# Patient Record
Sex: Female | Born: 1970 | Race: White | Hispanic: No | Marital: Married | Smoking: Current every day smoker
Health system: Southern US, Community
[De-identification: ages and names within clinical notes are randomized; demographics above are authoritative.]

## PROBLEM LIST (undated history)

## (undated) DIAGNOSIS — F419 Anxiety disorder, unspecified: Secondary | ICD-10-CM

## (undated) DIAGNOSIS — E559 Vitamin D deficiency, unspecified: Secondary | ICD-10-CM

## (undated) DIAGNOSIS — R7303 Prediabetes: Secondary | ICD-10-CM

## (undated) DIAGNOSIS — O99345 Other mental disorders complicating the puerperium: Secondary | ICD-10-CM

## (undated) DIAGNOSIS — M1990 Unspecified osteoarthritis, unspecified site: Secondary | ICD-10-CM

## (undated) DIAGNOSIS — F53 Postpartum depression: Secondary | ICD-10-CM

## (undated) DIAGNOSIS — G629 Polyneuropathy, unspecified: Secondary | ICD-10-CM

## (undated) DIAGNOSIS — O119 Pre-existing hypertension with pre-eclampsia, unspecified trimester: Secondary | ICD-10-CM

## (undated) DIAGNOSIS — I10 Essential (primary) hypertension: Secondary | ICD-10-CM

## (undated) DIAGNOSIS — K469 Unspecified abdominal hernia without obstruction or gangrene: Secondary | ICD-10-CM

## (undated) HISTORY — PX: HERNIA REPAIR: SHX51

## (undated) HISTORY — DX: Vitamin D deficiency, unspecified: E55.9

## (undated) HISTORY — PX: NERVE SURGERY: SHX1016

## (undated) HISTORY — PX: OTHER SURGICAL HISTORY: SHX169

## (undated) HISTORY — PX: ELBOW SURGERY: SHX618

## (undated) HISTORY — DX: Prediabetes: R73.03

---

## 1997-08-08 ENCOUNTER — Inpatient Hospital Stay (HOSPITAL_COMMUNITY): Admission: AD | Admit: 1997-08-08 | Discharge: 1997-08-09 | Payer: Self-pay | Admitting: *Deleted

## 1998-05-07 ENCOUNTER — Encounter: Payer: Self-pay | Admitting: Family Medicine

## 1998-05-07 ENCOUNTER — Ambulatory Visit (HOSPITAL_COMMUNITY): Admission: RE | Admit: 1998-05-07 | Discharge: 1998-05-07 | Payer: Self-pay | Admitting: Family Medicine

## 1998-09-29 ENCOUNTER — Other Ambulatory Visit: Admission: RE | Admit: 1998-09-29 | Discharge: 1998-09-29 | Payer: Self-pay | Admitting: *Deleted

## 2000-10-24 ENCOUNTER — Other Ambulatory Visit: Admission: RE | Admit: 2000-10-24 | Discharge: 2000-10-24 | Payer: Self-pay | Admitting: *Deleted

## 2001-12-24 ENCOUNTER — Other Ambulatory Visit: Admission: RE | Admit: 2001-12-24 | Discharge: 2001-12-24 | Payer: Self-pay | Admitting: Obstetrics and Gynecology

## 2002-05-05 ENCOUNTER — Emergency Department (HOSPITAL_COMMUNITY): Admission: EM | Admit: 2002-05-05 | Discharge: 2002-05-06 | Payer: Self-pay | Admitting: Emergency Medicine

## 2002-05-06 ENCOUNTER — Encounter: Payer: Self-pay | Admitting: Emergency Medicine

## 2003-01-02 ENCOUNTER — Other Ambulatory Visit: Admission: RE | Admit: 2003-01-02 | Discharge: 2003-01-02 | Payer: Self-pay | Admitting: Obstetrics and Gynecology

## 2005-03-07 ENCOUNTER — Emergency Department (HOSPITAL_COMMUNITY): Admission: EM | Admit: 2005-03-07 | Discharge: 2005-03-07 | Payer: Self-pay | Admitting: Emergency Medicine

## 2005-09-11 ENCOUNTER — Ambulatory Visit (HOSPITAL_COMMUNITY): Admission: RE | Admit: 2005-09-11 | Discharge: 2005-09-11 | Payer: Self-pay | Admitting: Obstetrics and Gynecology

## 2005-12-28 ENCOUNTER — Encounter: Admission: RE | Admit: 2005-12-28 | Discharge: 2005-12-28 | Payer: Self-pay | Admitting: Obstetrics and Gynecology

## 2006-02-09 ENCOUNTER — Inpatient Hospital Stay (HOSPITAL_COMMUNITY): Admission: AD | Admit: 2006-02-09 | Discharge: 2006-02-10 | Payer: Self-pay | Admitting: Obstetrics and Gynecology

## 2006-02-14 ENCOUNTER — Encounter: Admission: RE | Admit: 2006-02-14 | Discharge: 2006-03-16 | Payer: Self-pay | Admitting: Obstetrics and Gynecology

## 2006-03-17 ENCOUNTER — Encounter: Admission: RE | Admit: 2006-03-17 | Discharge: 2006-04-15 | Payer: Self-pay | Admitting: Obstetrics and Gynecology

## 2006-04-16 ENCOUNTER — Encounter: Admission: RE | Admit: 2006-04-16 | Discharge: 2006-05-16 | Payer: Self-pay | Admitting: Obstetrics and Gynecology

## 2006-05-17 ENCOUNTER — Encounter: Admission: RE | Admit: 2006-05-17 | Discharge: 2006-06-16 | Payer: Self-pay | Admitting: Obstetrics and Gynecology

## 2006-06-17 ENCOUNTER — Encounter: Admission: RE | Admit: 2006-06-17 | Discharge: 2006-07-15 | Payer: Self-pay | Admitting: Obstetrics and Gynecology

## 2006-07-16 ENCOUNTER — Encounter: Admission: RE | Admit: 2006-07-16 | Discharge: 2006-08-15 | Payer: Self-pay | Admitting: Obstetrics and Gynecology

## 2006-08-16 ENCOUNTER — Encounter: Admission: RE | Admit: 2006-08-16 | Discharge: 2006-09-14 | Payer: Self-pay | Admitting: Obstetrics and Gynecology

## 2006-09-15 ENCOUNTER — Encounter: Admission: RE | Admit: 2006-09-15 | Discharge: 2006-10-15 | Payer: Self-pay | Admitting: Obstetrics and Gynecology

## 2006-10-16 ENCOUNTER — Encounter: Admission: RE | Admit: 2006-10-16 | Discharge: 2006-11-14 | Payer: Self-pay | Admitting: Obstetrics and Gynecology

## 2006-11-15 ENCOUNTER — Encounter: Admission: RE | Admit: 2006-11-15 | Discharge: 2006-12-15 | Payer: Self-pay | Admitting: Obstetrics and Gynecology

## 2006-12-16 ENCOUNTER — Encounter: Admission: RE | Admit: 2006-12-16 | Discharge: 2007-01-15 | Payer: Self-pay | Admitting: Obstetrics and Gynecology

## 2007-01-16 ENCOUNTER — Encounter: Admission: RE | Admit: 2007-01-16 | Discharge: 2007-02-14 | Payer: Self-pay | Admitting: Obstetrics and Gynecology

## 2007-02-15 ENCOUNTER — Encounter: Admission: RE | Admit: 2007-02-15 | Discharge: 2007-03-17 | Payer: Self-pay | Admitting: Obstetrics and Gynecology

## 2007-03-18 ENCOUNTER — Encounter: Admission: RE | Admit: 2007-03-18 | Discharge: 2007-04-16 | Payer: Self-pay | Admitting: Obstetrics and Gynecology

## 2007-04-17 ENCOUNTER — Encounter: Admission: RE | Admit: 2007-04-17 | Discharge: 2007-05-17 | Payer: Self-pay | Admitting: Obstetrics and Gynecology

## 2007-05-18 ENCOUNTER — Encounter: Admission: RE | Admit: 2007-05-18 | Discharge: 2007-06-17 | Payer: Self-pay | Admitting: Obstetrics and Gynecology

## 2007-06-18 ENCOUNTER — Encounter: Admission: RE | Admit: 2007-06-18 | Discharge: 2007-07-18 | Payer: Self-pay | Admitting: Obstetrics and Gynecology

## 2007-10-30 ENCOUNTER — Encounter: Admission: RE | Admit: 2007-10-30 | Discharge: 2007-10-30 | Payer: Self-pay | Admitting: Obstetrics and Gynecology

## 2007-11-07 ENCOUNTER — Ambulatory Visit (HOSPITAL_COMMUNITY): Admission: RE | Admit: 2007-11-07 | Discharge: 2007-11-07 | Payer: Self-pay | Admitting: Obstetrics and Gynecology

## 2008-04-09 ENCOUNTER — Inpatient Hospital Stay (HOSPITAL_COMMUNITY): Admission: RE | Admit: 2008-04-09 | Discharge: 2008-04-10 | Payer: Self-pay | Admitting: Obstetrics and Gynecology

## 2008-05-08 ENCOUNTER — Emergency Department (HOSPITAL_COMMUNITY): Admission: EM | Admit: 2008-05-08 | Discharge: 2008-05-08 | Payer: Self-pay | Admitting: Emergency Medicine

## 2009-03-19 ENCOUNTER — Emergency Department (HOSPITAL_COMMUNITY): Admission: EM | Admit: 2009-03-19 | Discharge: 2009-03-19 | Payer: Self-pay | Admitting: Family Medicine

## 2009-12-13 ENCOUNTER — Ambulatory Visit (HOSPITAL_COMMUNITY): Admission: RE | Admit: 2009-12-13 | Discharge: 2009-12-13 | Payer: Self-pay | Admitting: Obstetrics and Gynecology

## 2010-02-10 ENCOUNTER — Ambulatory Visit (HOSPITAL_COMMUNITY): Admission: RE | Admit: 2010-02-10 | Discharge: 2010-02-10 | Payer: Self-pay | Admitting: Internal Medicine

## 2010-02-17 ENCOUNTER — Ambulatory Visit (HOSPITAL_COMMUNITY): Admission: RE | Admit: 2010-02-17 | Discharge: 2010-02-17 | Payer: Self-pay | Admitting: Internal Medicine

## 2010-06-13 ENCOUNTER — Other Ambulatory Visit (HOSPITAL_COMMUNITY): Payer: Self-pay | Admitting: Obstetrics and Gynecology

## 2010-06-13 DIAGNOSIS — Z1231 Encounter for screening mammogram for malignant neoplasm of breast: Secondary | ICD-10-CM

## 2010-06-28 ENCOUNTER — Ambulatory Visit (HOSPITAL_COMMUNITY)
Admission: RE | Admit: 2010-06-28 | Discharge: 2010-06-28 | Disposition: A | Discharge status: Home / Self Care | Payer: BC Managed Care – PPO | Source: Ambulatory Visit | Attending: Obstetrics and Gynecology | Admitting: Obstetrics and Gynecology

## 2010-06-28 DIAGNOSIS — Z1231 Encounter for screening mammogram for malignant neoplasm of breast: Secondary | ICD-10-CM | POA: Insufficient documentation

## 2010-07-15 LAB — SURGICAL PCR SCREEN
MRSA, PCR: NEGATIVE
Staphylococcus aureus: NEGATIVE

## 2010-07-15 LAB — HCG, SERUM, QUALITATIVE: Preg, Serum: NEGATIVE

## 2010-07-15 LAB — BASIC METABOLIC PANEL
BUN: 13 mg/dL (ref 6–23)
Calcium: 9.9 mg/dL (ref 8.4–10.5)
GFR calc non Af Amer: >60 mL/min (ref >60)
Glucose, Bld: 120 mg/dL — ABNORMAL HIGH (ref 70–99)
Potassium: 4 mEq/L (ref 3.5–5.1)

## 2010-07-15 LAB — CBC
HCT: 46.4 % — ABNORMAL HIGH (ref 36.0–46.0)
Hemoglobin: 16.1 g/dL — ABNORMAL HIGH (ref 12.0–15.0)
MCHC: 34.7 g/dL (ref 30.0–36.0)
RDW: 12.1 % (ref 11.5–15.5)
WBC: 8.2 10*3/uL (ref 4.0–10.5)

## 2010-08-16 ENCOUNTER — Other Ambulatory Visit: Payer: Self-pay | Admitting: Otolaryngology

## 2010-08-16 DIAGNOSIS — J3489 Other specified disorders of nose and nasal sinuses: Secondary | ICD-10-CM

## 2010-08-16 DIAGNOSIS — R52 Pain, unspecified: Secondary | ICD-10-CM

## 2010-09-13 NOTE — H&P (Signed)
NAMERYAN, PALERMO NO.:  [id number]   MEDICAL RECORD NO.:  [id number]          PATIENT TYPE:  INP   LOCATION:  9163                          FACILITY:  [facility]   PHYSICIAN:  Lenoard Aden, M.D.DATE OF BIRTH:  [birth year]   DATE OF ADMISSION:  04/09/2008  DATE OF DISCHARGE:                              HISTORY & PHYSICAL   CHIEF COMPLAINT:  Spontaneous rupture of membrane.   HISTORY OF PRESENT ILLNESS:  She is a 40 year old white female who G4,  P3 at 36-weeks gestation with spontaneous rupture of membranes, history  of insulin-dependent diabetes, history of depression on Zoloft with a  mature LS/PG by amnio 1 day ago.  She has a history of 7 pregnancies  with 4 uncomplicated vaginal deliveries.  She is a nonsmoker,  nondrinker.  She denies domestic physical violence.  Family history of  cancer, hypertension, diabetes, drug abuse.  No other medical or  surgical problems.  She has been on glyburide and metformin for a  glucose control, which has been suspect during the course of her  pregnancy.  She reports has normal growth on ultrasound.  She has a  history of depression on Zoloft.  She developed mild preeclampsia over  the last 1-2 weeks by blood pressure and proteuria criteria with normal  labs.   PHYSICAL EXAMINATION:  GENERAL:  Well-developed and well-nourished white  female in no acute distress.  HEENT:  Normal.  LUNGS:  Clear.  HEART:  Regular.  ABDOMEN:  Soft, gravid, and nontender.  Estimated fetal weight is 7  pounds.  Cervix is 9 cm, 100% vertex, +1.  EXTREMITIES:  No cords.  NEUROLOGIC:  Nonfocal.  SKIN:  Intact.   IMPRESSION:  1. A 36-week intrauterine pregnancy.  2. Insulin-dependent diabetes on glyburide and metformin.  3. Mild preeclampsia.  4. History of cerclage status post removal.   PLAN:  Anticipated attempts at vaginal delivery.      Lenoard Aden, M.D.  Electronically Signed     RJT/MEDQ  D:  04/09/2008  T:   04/09/2008  Job:  [id number]

## 2010-09-13 NOTE — Op Note (Signed)
NAMEJAZZLENE, Pamela Howe NO.:  [id number]   MEDICAL RECORD NO.:  [id number]          PATIENT TYPE:  AMB   LOCATION:  SDC                           FACILITY:  [facility]   PHYSICIAN:  Lenoard Aden, M.D.DATE OF BIRTH:  [birth year]   DATE OF PROCEDURE:  DATE OF DISCHARGE:                               OPERATIVE REPORT   PREOPERATIVE DIAGNOSIS:  Cervical incompetence of 14 weeks.   POSTOPERATIVE DIAGNOSIS:  Cervical incompetence of 14 weeks.   PROCEDURE:  McDonald cervical cerclage.   SURGEON:  Lenoard Aden, MD   ASSISTANT:  Lendon Colonel, M   ANESTHESIA:  Spinal.   ESTIMATED BLOOD LOSS:  Less than 50 mL.   COMPLICATIONS:  None.   DRAINS:  Foley.   COUNTS:  Correct.   The patient was taken to recovery in good condition.   After being apprised of risks of anesthesia, infection, bleeding, injury  to abdominal organs bladder or bowel with need for repair, possibility  of miscarriage less than 1, the patient was brought to the operating  room where fetal heart tones were ausculted prior to the procedure.  The  patient was given spinal anesthesia and placed in the [place] stirrups  without difficulty, prepped and draped in the sterile fashion.  Foley  catheter was placed after achieving adequate anesthesia.  A weighted  speculum was placed.  Anterior lip and posterior lip of the cervix was  grasped using a ring forceps.  A #5 Ethibond suture was placed at the  cervicovaginal junction in the standard fashion from 1 o'clock to 11  o'clock, 10 o'clock to 8 o'clock, 7 o'clock to 5 o'clock, 4 o'clock to 2  o'clock, and 2 o'clock back to 1 o'clock, tied anteriorly over a Prolene  tag without difficulty.  Minimal bleeding noted.  Good coaptation in the  internal os was palpated and no endocervical suture material was  appreciated.  Good hemostasis noted.  All instruments were removed.  The  patient tolerated the procedure and was transferred to recovery room  where fetal heart tones will be auscultated upon arrival.      Lenoard Aden, M.D.  Electronically Signed     RJT/MEDQ  D:  11/07/2007  T:  11/08/2007  Job:  [id number]

## 2011-01-26 LAB — CBC
HCT: 43.1
Hemoglobin: 15.1 — ABNORMAL HIGH
MCHC: 34.9
MCV: 89.6
Platelets: 262
RBC: 4.81
RDW: 14.5
WBC: 10.4

## 2011-02-03 LAB — COMPREHENSIVE METABOLIC PANEL
ALT: 16 U/L (ref 0–35)
Alkaline Phosphatase: 220 U/L — ABNORMAL HIGH (ref 39–117)
BUN: 5 mg/dL — ABNORMAL LOW (ref 6–23)
CO2: 20 mEq/L (ref 19–32)
Chloride: 105 mEq/L (ref 96–112)
GFR calc non Af Amer: >60 mL/min (ref >60)
Glucose, Bld: 105 mg/dL — ABNORMAL HIGH (ref 70–99)
Potassium: 3.6 mEq/L (ref 3.5–5.1)
Sodium: 135 mEq/L (ref 135–145)
Total Bilirubin: 0.2 mg/dL — ABNORMAL LOW (ref 0.3–1.2)
Total Protein: 5.7 g/dL — ABNORMAL LOW (ref 6.0–8.3)

## 2011-02-03 LAB — LACTATE DEHYDROGENASE: LDH: 146 U/L (ref 94–250)

## 2011-02-03 LAB — CBC
HCT: 38.7 % (ref 36.0–46.0)
HCT: 42.2 % (ref 36.0–46.0)
Hemoglobin: 13.3 g/dL (ref 12.0–15.0)
Hemoglobin: 14.5 g/dL (ref 12.0–15.0)
MCV: 93.1 fL (ref 78.0–100.0)
RBC: 4.52 MIL/uL (ref 3.87–5.11)
RDW: 14.4 % (ref 11.5–15.5)
WBC: 8.6 10*3/uL (ref 4.0–10.5)

## 2011-02-03 LAB — RPR: RPR Ser Ql: NONREACTIVE

## 2011-02-03 LAB — GLUCOSE, CAPILLARY

## 2011-09-19 IMAGING — CR DG CHEST 2V
2 series · 2 of 2 positions shown · non-contrast
Comparison: None

CLINICAL DATA: Cough congestion and shortness of breath.

CHEST - 2 VIEW

[view not recorded (1 of 2)]
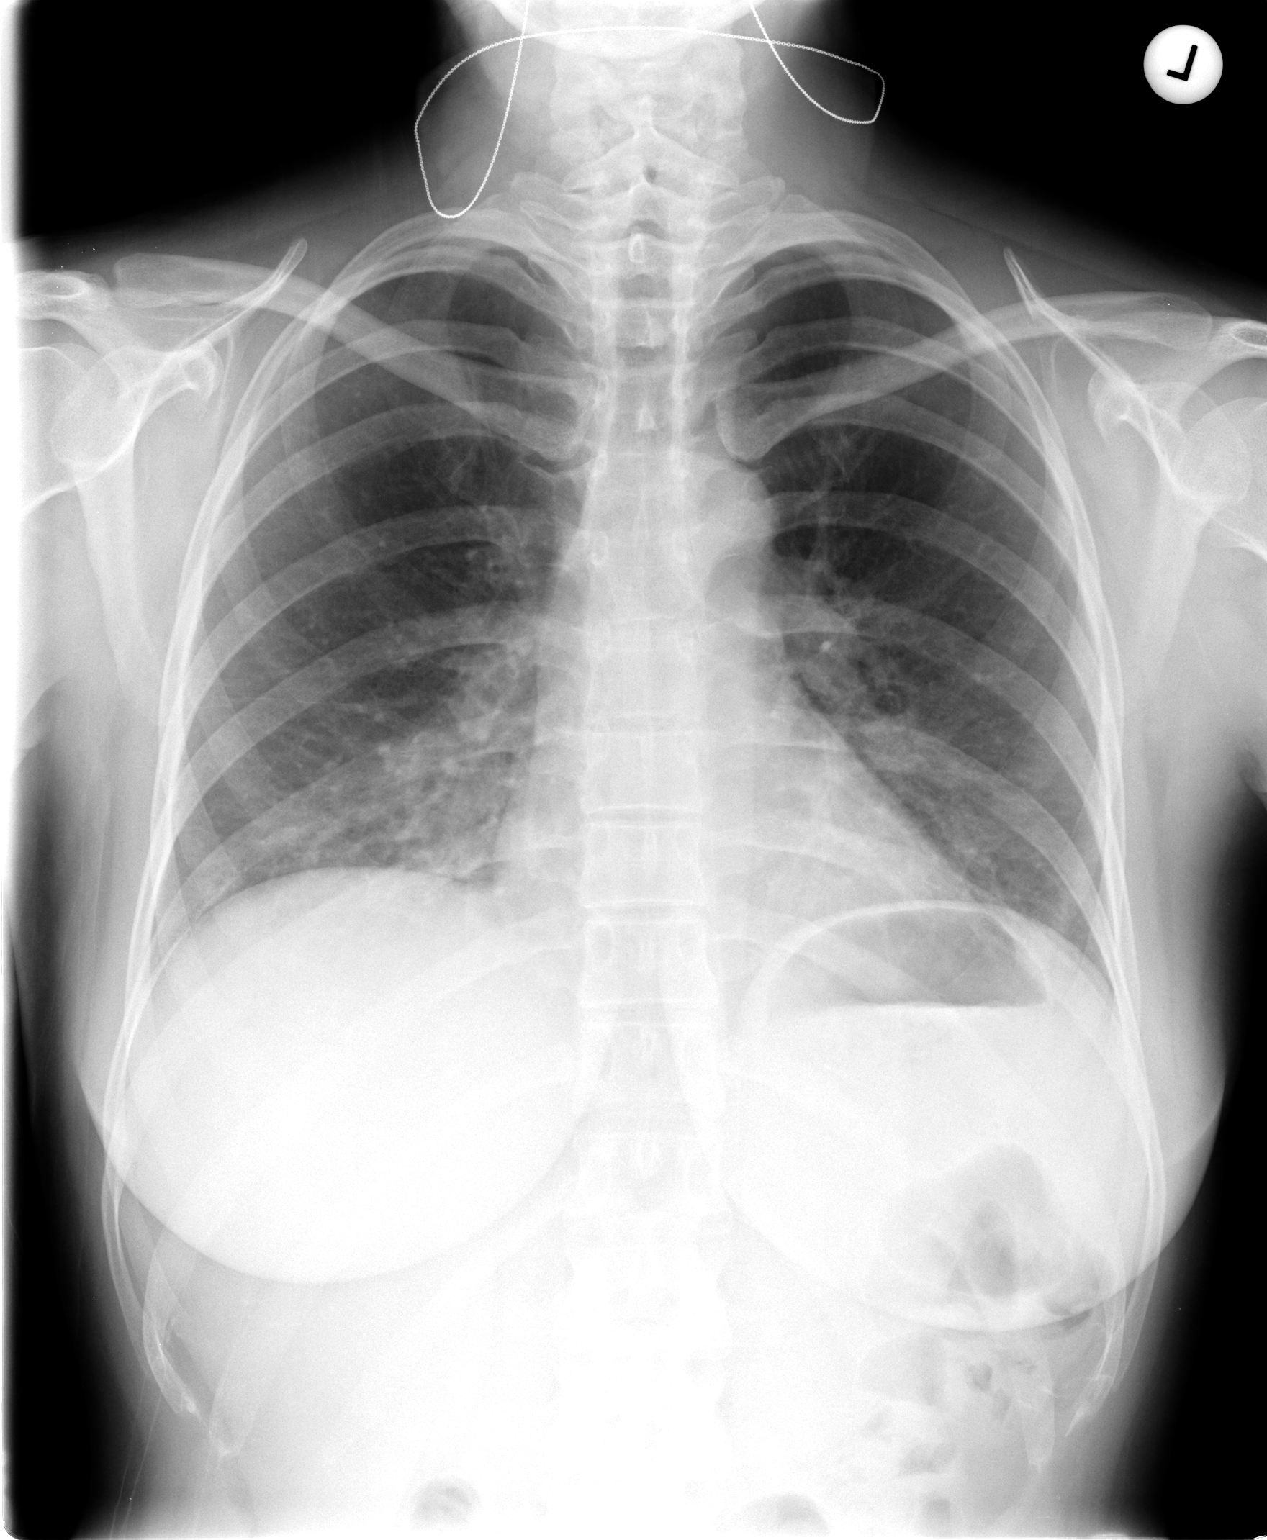

[view not recorded (2 of 2)]
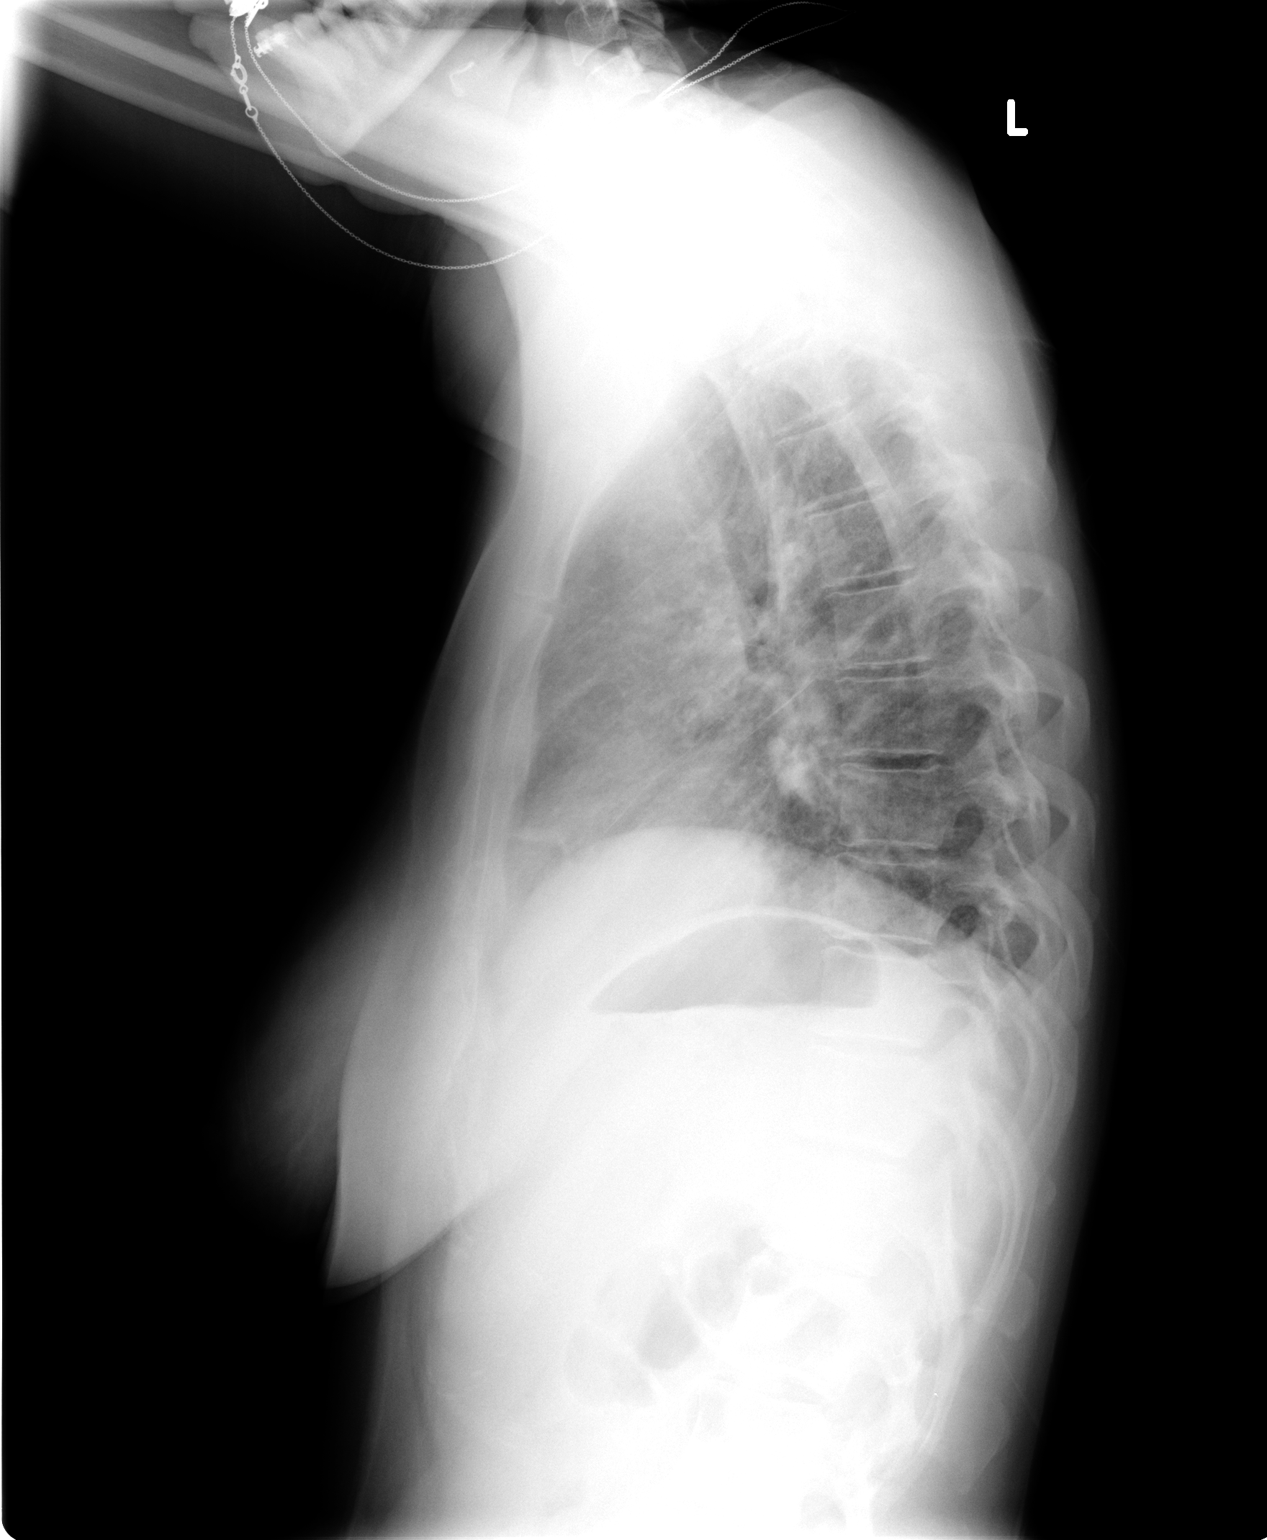

[2 of 2 positions shown; findings below may reference images not displayed]

FINDINGS: The cardiomediastinal silhouette is unremarkable.

Mild bilateral lower lung airspace opacities are noted and may
represent pneumonia.
There is no evidence of pleural effusion or pneumothorax.
No acute bony abnormalities are identified.
IMPRESSION: Mild bilateral lower lung airspace disease suspicious for bilateral
pneumonia.

## 2011-09-26 IMAGING — CR DG CHEST 2V
2 series · 2 of 2 positions shown · non-contrast
Comparison: 02/10/2010

CLINICAL DATA: Basilar pneumonia

CHEST - 2 VIEW

[view not recorded (1 of 2)]
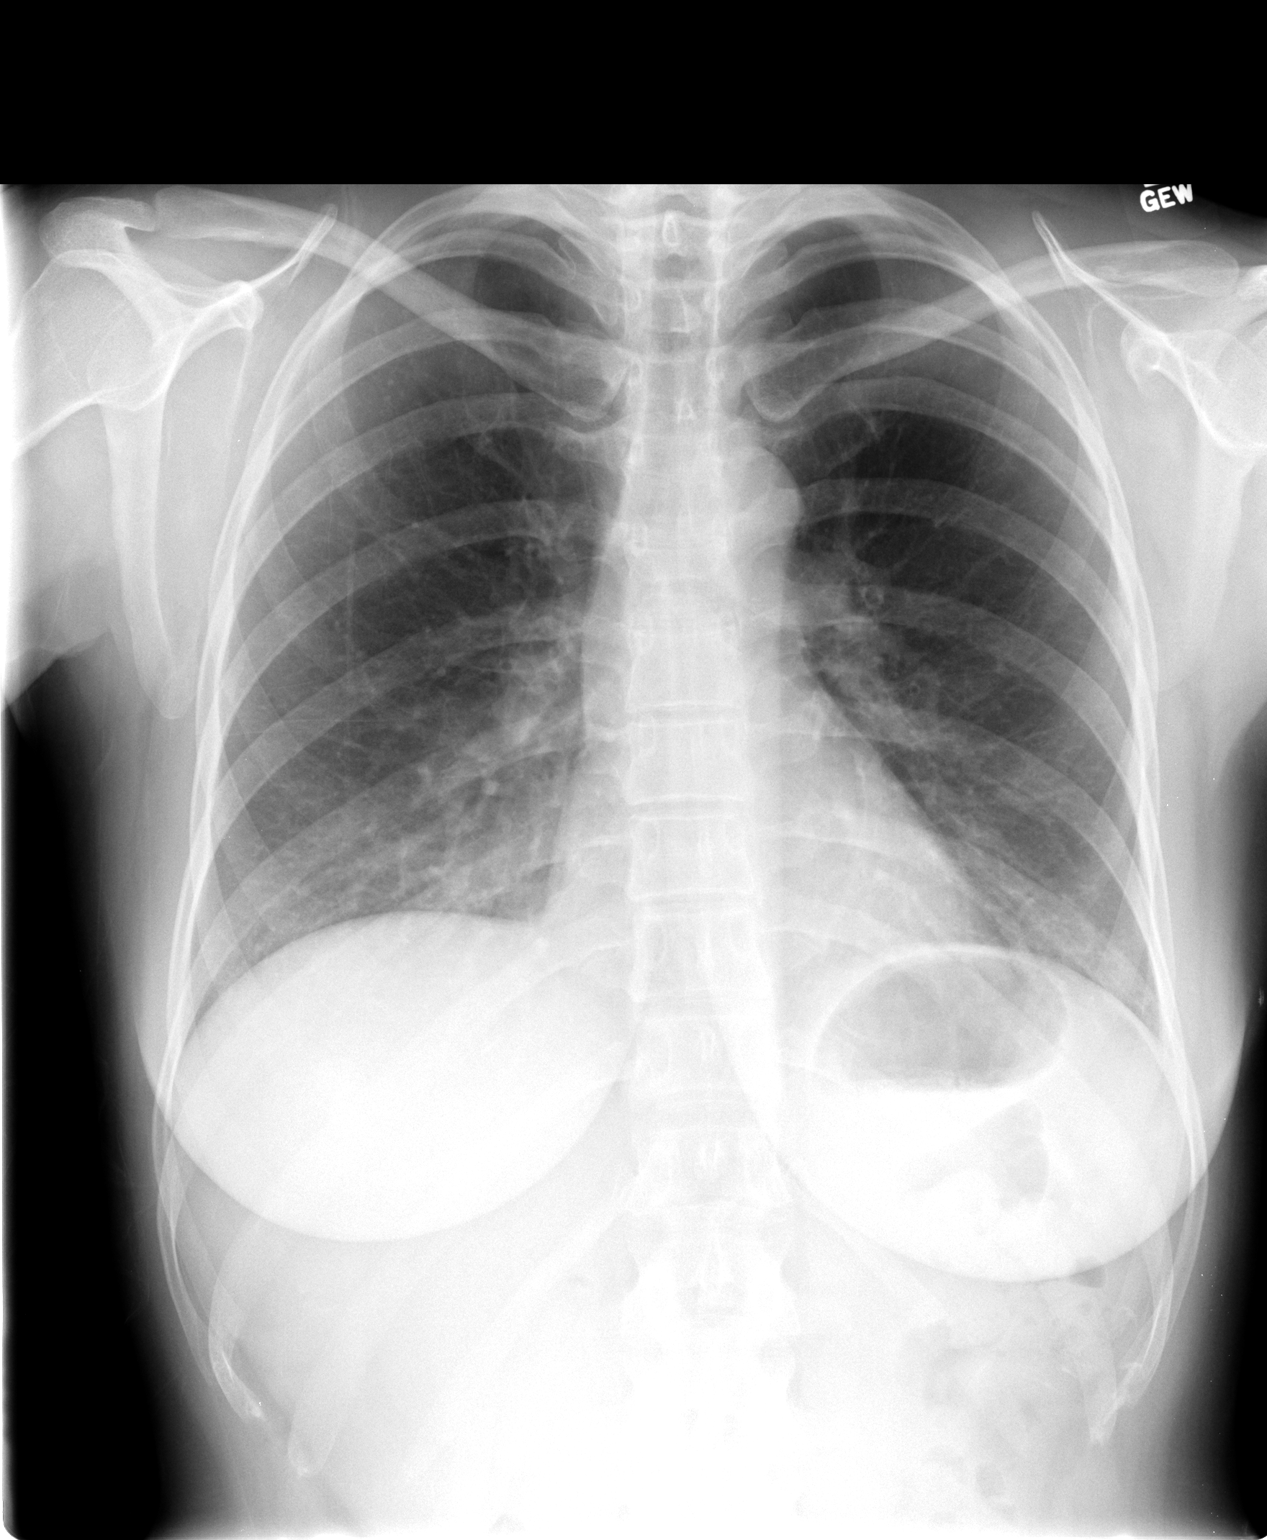

[view not recorded (2 of 2)]
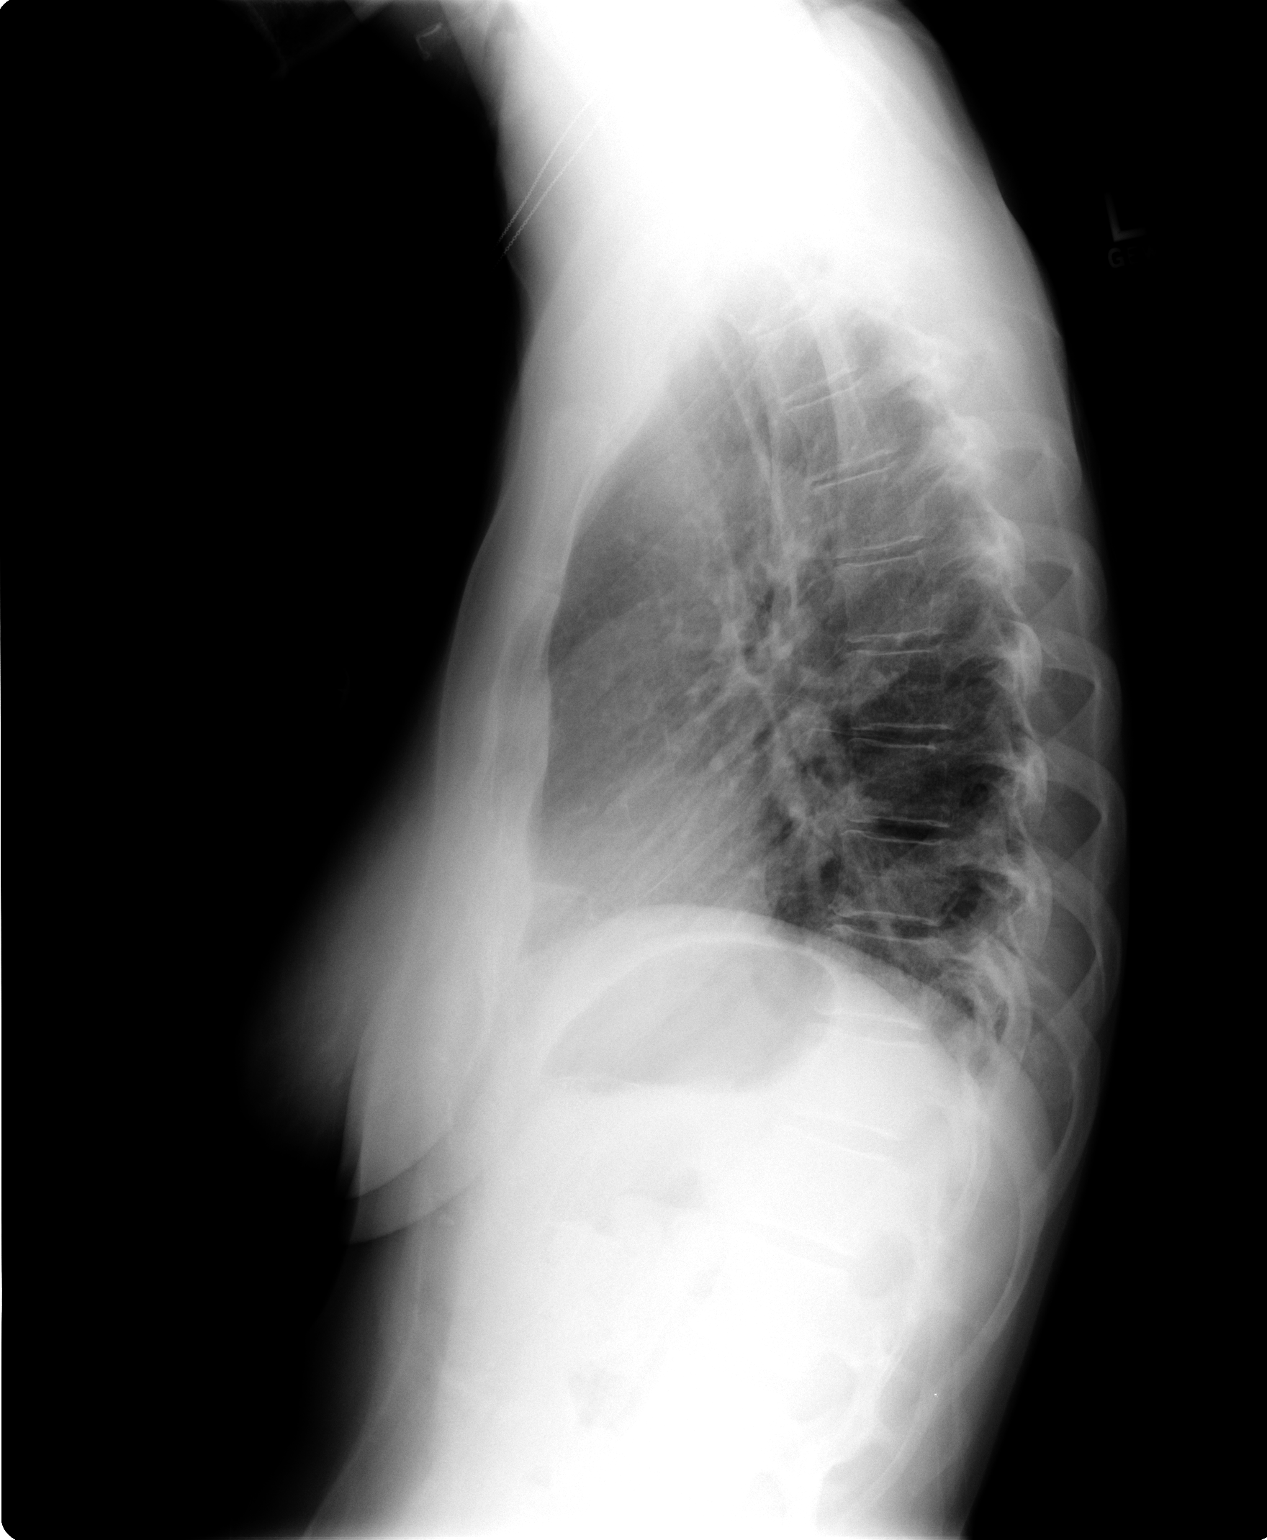

[2 of 2 positions shown; findings below may reference images not displayed]

FINDINGS: Better lung volumes and lower lobe aeration.  Prominent
basilar vascular markings.  Normal heart size and vascularity.  No
current pneumonia, edema, effusion or pneumothorax.  Midline
trachea.
IMPRESSION: Better lung volumes and basilar aeration.

## 2011-12-03 ENCOUNTER — Encounter (HOSPITAL_COMMUNITY): Payer: Self-pay | Admitting: Emergency Medicine

## 2011-12-03 ENCOUNTER — Emergency Department (HOSPITAL_COMMUNITY)
Admission: EM | Admit: 2011-12-03 | Discharge: 2011-12-03 | Disposition: A | Discharge status: Home / Self Care | Payer: BC Managed Care – PPO | Attending: Emergency Medicine | Admitting: Emergency Medicine

## 2011-12-03 DIAGNOSIS — F17200 Nicotine dependence, unspecified, uncomplicated: Secondary | ICD-10-CM | POA: Insufficient documentation

## 2011-12-03 DIAGNOSIS — R092 Respiratory arrest: Secondary | ICD-10-CM

## 2011-12-03 DIAGNOSIS — Z79899 Other long term (current) drug therapy: Secondary | ICD-10-CM | POA: Insufficient documentation

## 2011-12-03 DIAGNOSIS — F3289 Other specified depressive episodes: Secondary | ICD-10-CM | POA: Insufficient documentation

## 2011-12-03 DIAGNOSIS — F329 Major depressive disorder, single episode, unspecified: Secondary | ICD-10-CM | POA: Insufficient documentation

## 2011-12-03 DIAGNOSIS — F411 Generalized anxiety disorder: Secondary | ICD-10-CM | POA: Insufficient documentation

## 2011-12-03 DIAGNOSIS — I10 Essential (primary) hypertension: Secondary | ICD-10-CM | POA: Insufficient documentation

## 2011-12-03 HISTORY — DX: Pre-existing hypertension with pre-eclampsia, unspecified trimester: O11.9

## 2011-12-03 HISTORY — DX: Unspecified abdominal hernia without obstruction or gangrene: K46.9

## 2011-12-03 HISTORY — DX: Polyneuropathy, unspecified: G62.9

## 2011-12-03 HISTORY — DX: Other mental disorders complicating the puerperium: O99.345

## 2011-12-03 HISTORY — DX: Essential (primary) hypertension: I10

## 2011-12-03 HISTORY — DX: Postpartum depression: F53.0

## 2011-12-03 HISTORY — DX: Unspecified osteoarthritis, unspecified site: M19.90

## 2011-12-03 HISTORY — DX: Anxiety disorder, unspecified: F41.9

## 2011-12-03 LAB — URINALYSIS, MICROSCOPIC ONLY
Bilirubin Urine: NEGATIVE
Glucose, UA: 100 mg/dL — AB
Ketones, ur: NEGATIVE mg/dL
Protein, ur: NEGATIVE mg/dL
pH: 6 (ref 5.0–8.0)

## 2011-12-03 LAB — RAPID URINE DRUG SCREEN, HOSP PERFORMED
Amphetamines: POSITIVE — AB
Cocaine: NOT DETECTED
Opiates: NOT DETECTED
Tetrahydrocannabinol: NOT DETECTED

## 2011-12-03 LAB — POCT I-STAT, CHEM 8
BUN: 16 mg/dL (ref 6–23)
Calcium, Ion: 1.21 mmol/L (ref 1.12–1.23)
Chloride: 99 mEq/L (ref 96–112)
Glucose, Bld: 327 mg/dL — ABNORMAL HIGH (ref 70–99)
HCT: 52 % — ABNORMAL HIGH (ref 36.0–46.0)
TCO2: 24 mmol/L (ref 0–100)

## 2011-12-03 LAB — CBC WITH DIFFERENTIAL/PLATELET
Basophils Absolute: 0 10*3/uL (ref 0.0–0.1)
Eosinophils Absolute: 0 10*3/uL (ref 0.0–0.7)
MCH: 31.8 pg (ref 26.0–34.0)
MCHC: 35.7 g/dL (ref 30.0–36.0)
Monocytes Absolute: 1 10*3/uL (ref 0.1–1.0)
Neutrophils Relative %: 51 % (ref 43–77)
Platelets: 366 10*3/uL (ref 150–400)
RDW: 12.4 % (ref 11.5–15.5)

## 2011-12-03 LAB — ACETAMINOPHEN LEVEL: Acetaminophen (Tylenol), Serum: <15 ug/mL (ref 10–30)

## 2011-12-03 MED ORDER — NALOXONE HCL 1 MG/ML IJ SOLN
0.8000 mg | Freq: Once | INTRAMUSCULAR | Status: AC
  Administered 2011-12-03: 0.8 mg via INTRAVENOUS

## 2011-12-03 NOTE — ED Provider Notes (Addendum)
Patient was seen by family practice teaching service for admission. She refuses admission at this time. States that the medication sheet that would help her sleep do to increased emotional and family issues at home. She denies this being a suicide attempt. She was offered admission for observation and she has refused at this time. The risk of rebound respirations depression was explained to her and her husband. It was also states them that she could become apneic again resulting in premature death. She understands this and will sign out AGAINST MEDICAL ADVICE. Patient appears competent and has the capacity to make this decision  Toy Baker, MD 12/03/11 1756  Toy Baker, MD 12/03/11 [phone]

## 2011-12-03 NOTE — ED Notes (Signed)
Glucose level reading 330

## 2011-12-03 NOTE — ED Notes (Signed)
Dr. Radford Pax given I-stat labs.

## 2011-12-03 NOTE — ED Notes (Signed)
Pt was found by friend on the couch unresponsive.

## 2011-12-03 NOTE — H&P (Signed)
Family Medicine Teaching [facility] Admission History and Physical Service Pager: [phone]  Patient name: Pamela Howe Medical record number: [id number] Date of birth: 12-22-1970 Age: 41 y.o. Gender: female  Primary Care Provider: per pt - Dr. Billy Coast of [facility]  Chief Complaint: found unresponsive  History of Present Illness: Pamela Howe is a 41 y.o. year old female presenting after being found unresponsive at home.   History was obtained from the patient, who is very tangential in her speech and thought process.  Yesterday morning throat hurt, cough, sinuses, ears hurting, thought she was having reoccurrence of URI. This morning, she says she had a severe migraine and checked a fasting CBG that was 145. Pt's dad came to her house this morning to get her two small children [place]. While her dad was still there, she began coughing and had a sore throat, so she took 1.5 tsp of medicine with codeine in it. Dad started telling pt some upsetting things about her mother's family wanting to obtain her mother's possessions. Pt reports that she became very upset upon hearing these things, and had a severe anxiety attack. She began to have severe chest pain and a rapid heartbeat. She checked her blood pressure, which was 173/150. She tried to calm down and relax, then retook her blood pressure but it was still elevated. Then she took 1mg  of Xanax, and was still feeling extremely anxious to an hour later, so she took another 0.5mg  Xanax. Also at some point took this morning two Aleves, a Zyrtec, Zoloft, Bisoprolol, and HCTZ. Then laid down on the couch with her mother's dog, and fell asleep.  Husband came home and found pt asleep and tried to wake her up but could not wake her. She was moaning but not responsive so husband brought her here to the hospital. In the emergency room, patient was reportedly nonresponsive with a GCS of 3, and was about to be intubated when she  received 0.8 mg of Narcan and woke up.  Pt has been under considerable stress recently; reports that she has not slept in one month. Her mother died two weeks ago on 12/02/2022. Pt admits to feeling depressed and having a history of postpartum depression, but denies that she took any of the medicine to purposefully end her life. She is adamant about not wanting to be admitted to the hospital, as she is worried that her father will become distraught if he finds out she is sick. Pt's husband reiterates this concern, saying her dad might have a heart attack if he finds out his only child is in the hospital two weeks after losing his wife. Patient states that she can call her doctor in the morning to make an appointment. An RN in ED says that pt has a visit scheduled for follow-up counseling tomorrow with the hospice team that took care of her mother.  Home Meds As Reported by Patient: Neurontin 300mg  TID for arm pain.  Zyrtec D 24 H Vitamin Zoloft 100mg  BID Bisoprolol HCTZ Crestor Ambien for insomnia - states has rx but does not take.   There is no problem list on file for this patient.  Past Medical History: Past Medical History  Diagnosis Date  . Hypertension   . Neuropathy   . Arthritis   . Preeclampsia complicating hypertension   . Anxiety   . Postpartum depression   . Hernia   Pneumonia Viral meningitis  Past Surgical History: Past Surgical  History  Procedure Date  . Nerve surgery     (R) hand  . Elbow surgery   . Hernia repair   . Uterine ablation   Urinary sling  Social History: History  Substance Use Topics  . Smoking status: Current Everyday Smoker  . Smokeless tobacco: Not on file  . Alcohol Use: No  Pt has five children: 19, 16, 14, 5, 3 (six children total; oldest child died) Does not drink alcohol.  For any additional social history documentation, please refer to relevant sections of EMR.  Family History: Family History  Problem Relation Age of Onset  .  Cancer Mother   . Heart failure Father   . Hypertension Father    Allergies: No Known Allergies No current facility-administered medications on file prior to encounter.   Current Outpatient Prescriptions on File Prior to Encounter  Medication Sig Dispense Refill  . HYDROCHLOROTHIAZIDE PO Take 0.5 tablets by mouth 2 (two) times daily.      . Rosuvastatin Calcium (CRESTOR PO) Take 1 tablet by mouth daily.      . sertraline (ZOLOFT) 100 MG tablet Take 200 mg by mouth daily.      Marland Kitchen zolpidem (AMBIEN) 10 MG tablet Take 10 mg by mouth at bedtime as needed. For sleep       Review Of Systems: Per HPI. Otherwise 12 point review of systems was performed and was unremarkable.  Physical Exam: BP 155/98  Pulse 110  Resp 11  SpO2 100% Physical Exam: General: Anxious, intermittently tearful HEENT: PERRLA, extra ocular movement intact, oropharynx clear, no lesions and neck supple with midline trachea Heart: S1, S2 normal, no murmur, rub or gallop, regular rate and rhythm Lungs: clear to auscultation, no wheezes or rales and unlabored breathing Abdomen: abdomen is soft without significant tenderness, masses, organomegaly or guarding Extremities: extremities normal, atraumatic, no cyanosis or edema Musculoskeletal: no joint tenderness, deformity or swelling Skin:no rashes Neurology: normal without focal findings, mental status, speech normal, alert and oriented x3, reflexes normal and symmetric, 5/5 strength. Psych: thought process circumferential/tangential, repeatedly brings conversation back to her mother, seems preoccupied with father's death.  Labs and Imaging: CBC BMET   Lab 12/03/11 1454  WBC 16.9*  HGB 17.0*  HCT 47.6*  PLT 366    Lab 12/03/11 1437  NA 136  K 3.1*  CL 99  CO2 --  BUN 16  CREATININE 1.10  GLUCOSE 327*  CALCIUM --     Assessment and Plan. 41 y/o F pt consulted to our team for admission for AMS that resolved after one dose of Narcan. Pt was with GCS of 3  close to be intubated when she responded to Narcan administration. Was very hard to obtain a good story since pt is very tangential, but she was very clear denying intentional overdose. We recommended admission for close monitoring overnight since Narcan can ware off and pt can return to some level respiratory depression. Pt  decline admission and wanted to leave AMA. I spoke with Dr. Freida Busman and together we spoke with pt and husband (with pt's permission) about risks of leaving AMA. Pt was very firm that she wanted to leave.   D. [place]oto Rolene Arbour, MD Family Medicine  PGY-2 12/03/2011, 4:48 PM

## 2011-12-03 NOTE — ED Notes (Signed)
2 earrings placed in cup and given to the boyfriend.

## 2011-12-03 NOTE — ED Notes (Addendum)
Pt very emotional and tearful while talking w/this RN. 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 Pt reports intermittent episodes of chest palpitations, chest heaviness, dizziness, and weakness. Pt reports feeling nauseous and SOB w/chest heaviness and palpitations. Pt reports she is unable to sleep since her mother dx w/cancer, pt reports she sleeps in "2 hour increments." Pt also reports decreased appetite since her mother passed away. Pt denies SI/HI. Pt reports Hospice called her on Friday to set up some out patient counseling for pt, pt reports she missed the call and was planning on returning the call on Monday.

## 2011-12-03 NOTE — ED Notes (Signed)
MD at bedside. 

## 2011-12-03 NOTE — ED Notes (Signed)
After pt receiving Narcan begins breathing on her own and lifts her head.

## 2011-12-03 NOTE — ED Notes (Signed)
5 silver rings given to the boyfriend

## 2011-12-03 NOTE — ED Notes (Signed)
Pt reports she was prescribed a decongestant/cough syrup for her URI/Bronchitis in April, pt reports taking a teaspoonful of the syrup this afternoon and laid down on the couch "hoping to take a nap." Pt reports the syrup is Hydrocodone syrup. Pt also reports she takes 2 BP meds for HTN and is unable to get her BP "under control."

## 2011-12-03 NOTE — ED Notes (Addendum)
Pt states "I took a nerve medicine and some cough medicine. I haven't slept in days."

## 2011-12-03 NOTE — ED Provider Notes (Signed)
History     CSN: [id number]  Arrival date & time 12/03/11  1429   First MD Initiated Contact with Patient 12/03/11 1440      Chief Complaint  Patient presents with  . Respiratory Arrest     HPI  He presented to the emergency department unresponsive with decreased respiratory drive in cyanosis.  The husband reported he found her at home that way. He denies a any illegal street drugs.  He states she does take medication for her nerves.  In the emergency department patient's respirations were assisted with a bag valve mask.  Her O2 sats remained 100%.  We prepared her for intubation but prior to intubating her she was given 0.8 mg of Narcan and she became awake alert and oriented.   Pt very emotional and tearful while talking w/this RN. 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 Pt reports intermittent episodes of chest palpitations, chest heaviness, dizziness, and weakness. Pt reports feeling nauseous and SOB w/chest heaviness and palpitations. Pt reports she is unable to sleep since her mother dx w/cancer, pt reports she sleeps in "2 hour increments." Pt also reports decreased appetite since her mother passed away. Pt denies SI/HI. Pt reports Hospice called her on Friday to set up some out patient counseling for pt, pt reports she missed the call and was planning on returning the call on Monday.   Past Medical History    Diagnosis Date  . Hypertension   . Neuropathy   . Arthritis   . Preeclampsia complicating hypertension   . Anxiety   . Postpartum depression   . Hernia     Past Surgical History  Procedure Date  . Nerve surgery     (R) hand  . Elbow surgery   . Hernia repair   . Uterine ablation     Family History  Problem Relation Age of Onset  . Cancer Mother   . Heart failure Father   . Hypertension Father     History  Substance Use Topics  . Smoking status: Current Everyday Smoker  . Smokeless tobacco: Not on file  . Alcohol Use: No    OB History    Grav Para Term Preterm Abortions TAB SAB Ect Mult Living   6 5 5  1  1   5       Review of Systems  Unable to perform ROS: Other    Allergies  Review of patient's allergies indicates no known allergies.  Home Medications   Current Outpatient Rx  Name Route Sig Dispense Refill  . ALPRAZOLAM 0.5 MG PO TABS Oral Take 0.25-0.5 mg by mouth at bedtime as needed. For anxiety    . BUSPAR PO Oral Take 1 tablet by mouth daily.    Marland Kitchen HYDROCHLOROTHIAZIDE PO Oral Take 0.5 tablets by mouth 2 (two) times daily.    . CRESTOR PO Oral Take 1 tablet  by mouth daily.    . SERTRALINE HCL 100 MG PO TABS Oral Take 200 mg by mouth daily.    Marland Kitchen ZOLPIDEM TARTRATE 10 MG PO TABS Oral Take 10 mg by mouth at bedtime as needed. For sleep      BP 116/72  Pulse 93  Resp 20  SpO2 97%  Physical Exam  Nursing note and vitals reviewed. Constitutional: She appears well-developed. She appears distressed.  HENT:  Head: Normocephalic and atraumatic.  Eyes: Pupils are equal, round, and reactive to light.  Neck: Normal range of motion.  Cardiovascular: Normal rate and intact distal pulses.   Pulmonary/Chest: She is in respiratory distress.  Abdominal: Normal appearance. She exhibits no distension. There is no tenderness. There is no rebound.  Musculoskeletal: Normal range of motion.  Neurological: She is unresponsive. GCS eye subscore is 1. GCS verbal  subscore is 1. GCS motor subscore is 1.  Skin: Skin is dry. There is cyanosis.  Psychiatric: She has a normal mood and affect. Her behavior is normal.    ED Course  Procedures (including critical care time) CRITICAL CARE Performed by: Nelva Nay L   Total critical care time: 30 min  Critical care time was exclusive of separately billable procedures and treating other patients.  Critical care was necessary to treat or prevent imminent or life-threatening deterioration.  Critical care was time spent personally by me on the following activities: development of treatment plan with patient and/or surrogate as well as nursing, discussions with consultants, evaluation of patient's response to treatment, examination of patient, obtaining history from patient or surrogate, ordering and performing treatments and interventions, ordering and review of laboratory studies, ordering and review of radiographic studies, pulse oximetry and re-evaluation of patient's condition.  Labs Reviewed  POCT I-STAT, CHEM 8 - Abnormal; Notable for the following:    Potassium 3.1 (*)     Glucose, Bld 327 (*)     Hemoglobin 17.7 (*)     HCT 52.0 (*)     All other components within normal limits  URINALYSIS, WITH MICROSCOPIC - Abnormal; Notable for the following:    Glucose, UA 100 (*)     Squamous Epithelial / LPF FEW (*)     Casts GRANULAR CAST (*)     All other components within normal limits  URINE RAPID DRUG SCREEN (HOSP PERFORMED) - Abnormal; Notable for the following:    Benzodiazepines POSITIVE (*)     Amphetamines POSITIVE (*)     All other components within normal limits  CBC WITH DIFFERENTIAL - Abnormal; Notable for the following:    WBC 16.9 (*)     RBC 5.34 (*)     Hemoglobin 17.0 (*)     HCT 47.6 (*)     Neutro Abs 8.6 (*)     Lymphs Abs 7.3 (*)     All other components within normal limits  SALICYLATE LEVEL - Abnormal; Notable for the following:    Salicylate Lvl <2.0 (*)     All other  components within normal limits  ACETAMINOPHEN LEVEL   No results found.   1. Respiratory arrest       MDM          Nelia Shi, MD 12/03/11 2154

## 2011-12-03 NOTE — ED Notes (Signed)
Narcan 0.4 mg IV given

## 2011-12-03 NOTE — Progress Notes (Signed)
Patient Pamela Howe, 41 year old white female was found at home on the couch "unresponsive" before being moved via EMS to E.D. trauma C.  Patient's husband and two children expressed appreciation for Chaplain's provision of a non-anxious presence.  I will follow up as needed.

## 2011-12-03 NOTE — ED Notes (Signed)
Pt very emotional when speaking with pharmacy tech.  She states "I have been going through a lot recently with mothers recent death.  I haven't been sleeping. I took my Xanax."

## 2011-12-03 NOTE — ED Notes (Signed)
Pt given a copy of her lab results per admitting MD request

## 2011-12-04 LAB — PATHOLOGIST SMEAR REVIEW: Path Review: REACTIVE

## 2011-12-04 LAB — GLUCOSE, CAPILLARY

## 2011-12-04 NOTE — H&P (Signed)
[facility] Attending Daily Note:  Renold Don MD  [phone] pager  Family Practice pager:  [phone] I reviewed this patient and have reviewed their chart. I have discussed this patient with the resident. I agree with the resident's findings, assessment and care plan.  Briefly, 41 yo F who took a number of medications per resident and ED physician's notes and was found at home unresponsive.  Reportedly GCS scale of 3 and patient was being prepped for intubation when she awoke secondary to Narcan administration.    Patient was to be admitted for overnight observation since Narcan is relatively short-lived and there was concern for deterioration of mental status and respiratory embarrassment.  This was discussed with patient by admitting physicians but patient left ED against medical advice.

## 2012-03-01 ENCOUNTER — Encounter (HOSPITAL_COMMUNITY): Payer: Self-pay | Admitting: Emergency Medicine

## 2012-03-01 ENCOUNTER — Emergency Department (HOSPITAL_COMMUNITY)
Admission: EM | Admit: 2012-03-01 | Discharge: 2012-03-02 | Disposition: A | Discharge status: Home / Self Care | Payer: BC Managed Care – PPO | Attending: Emergency Medicine | Admitting: Emergency Medicine

## 2012-03-01 DIAGNOSIS — F329 Major depressive disorder, single episode, unspecified: Secondary | ICD-10-CM

## 2012-03-01 DIAGNOSIS — I10 Essential (primary) hypertension: Secondary | ICD-10-CM | POA: Insufficient documentation

## 2012-03-01 DIAGNOSIS — Z8739 Personal history of other diseases of the musculoskeletal system and connective tissue: Secondary | ICD-10-CM | POA: Insufficient documentation

## 2012-03-01 DIAGNOSIS — F3289 Other specified depressive episodes: Secondary | ICD-10-CM | POA: Insufficient documentation

## 2012-03-01 DIAGNOSIS — F411 Generalized anxiety disorder: Secondary | ICD-10-CM | POA: Insufficient documentation

## 2012-03-01 DIAGNOSIS — E876 Hypokalemia: Secondary | ICD-10-CM

## 2012-03-01 DIAGNOSIS — G589 Mononeuropathy, unspecified: Secondary | ICD-10-CM | POA: Insufficient documentation

## 2012-03-01 DIAGNOSIS — N390 Urinary tract infection, site not specified: Secondary | ICD-10-CM | POA: Insufficient documentation

## 2012-03-01 NOTE — ED Notes (Signed)
Pt alert, arrives from home, c/o depression, recent death in family, denies si/hi, resp even unlabored, skin pwd

## 2012-03-02 ENCOUNTER — Encounter (HOSPITAL_COMMUNITY): Payer: Self-pay | Admitting: *Deleted

## 2012-03-02 LAB — COMPREHENSIVE METABOLIC PANEL
Alkaline Phosphatase: 94 U/L (ref 39–117)
BUN: 17 mg/dL (ref 6–23)
CO2: 27 mEq/L (ref 19–32)
GFR calc Af Amer: >90 mL/min (ref >90)
GFR calc non Af Amer: >90 mL/min (ref >90)
Glucose, Bld: 134 mg/dL — ABNORMAL HIGH (ref 70–99)
Potassium: 2.6 mEq/L — CL (ref 3.5–5.1)
Total Protein: 8.4 g/dL — ABNORMAL HIGH (ref 6.0–8.3)

## 2012-03-02 LAB — RAPID URINE DRUG SCREEN, HOSP PERFORMED
Barbiturates: NOT DETECTED
Benzodiazepines: POSITIVE — AB

## 2012-03-02 LAB — BASIC METABOLIC PANEL
CO2: 28 mEq/L (ref 19–32)
Calcium: 9.5 mg/dL (ref 8.4–10.5)
Chloride: 89 mEq/L — ABNORMAL LOW (ref 96–112)
Glucose, Bld: 104 mg/dL — ABNORMAL HIGH (ref 70–99)
Potassium: 3.3 mEq/L — ABNORMAL LOW (ref 3.5–5.1)
Sodium: 130 mEq/L — ABNORMAL LOW (ref 135–145)

## 2012-03-02 LAB — CBC
HCT: 43.6 % (ref 36.0–46.0)
Hemoglobin: 16 g/dL — ABNORMAL HIGH (ref 12.0–15.0)
MCH: 30.7 pg (ref 26.0–34.0)
MCHC: 36.7 g/dL — ABNORMAL HIGH (ref 30.0–36.0)
RBC: 5.22 MIL/uL — ABNORMAL HIGH (ref 3.87–5.11)

## 2012-03-02 LAB — ETHANOL: Alcohol, Ethyl (B): <11 mg/dL (ref 0–11)

## 2012-03-02 MED ORDER — INSULIN ASPART 100 UNIT/ML ~~LOC~~ SOLN: 0.0000 [IU] | Freq: Every day | SUBCUTANEOUS | Status: DC

## 2012-03-02 MED ORDER — HYDROCHLOROTHIAZIDE 25 MG PO TABS
25.0000 mg | ORAL_TABLET | Freq: Every day | ORAL | Status: DC
  Administered 2012-03-02: 25 mg via ORAL
  Filled 2012-03-02: qty 1

## 2012-03-02 MED ORDER — LORAZEPAM 1 MG PO TABS: 1.0000 mg | ORAL_TABLET | Freq: Three times a day (TID) | ORAL | Status: DC | PRN

## 2012-03-02 MED ORDER — SULFAMETHOXAZOLE-TMP DS 800-160 MG PO TABS: 1.0000 | ORAL_TABLET | Freq: Two times a day (BID) | ORAL | Status: DC

## 2012-03-02 MED ORDER — INSULIN ASPART 100 UNIT/ML ~~LOC~~ SOLN
0.0000 [IU] | Freq: Three times a day (TID) | SUBCUTANEOUS | Status: DC
  Filled 2012-03-02: qty 1

## 2012-03-02 MED ORDER — ZOLPIDEM TARTRATE 10 MG PO TABS: 10.0000 mg | ORAL_TABLET | Freq: Every evening | ORAL | Status: DC | PRN

## 2012-03-02 MED ORDER — ALPRAZOLAM 0.25 MG PO TABS: 0.2500 mg | ORAL_TABLET | Freq: Every evening | ORAL | Status: DC | PRN

## 2012-03-02 MED ORDER — ALBUTEROL SULFATE HFA 108 (90 BASE) MCG/ACT IN AERS: 2.0000 | INHALATION_SPRAY | Freq: Four times a day (QID) | RESPIRATORY_TRACT | Status: DC | PRN

## 2012-03-02 MED ORDER — POTASSIUM CHLORIDE CRYS ER 20 MEQ PO TBCR
40.0000 meq | EXTENDED_RELEASE_TABLET | Freq: Once | ORAL | Status: AC
  Administered 2012-03-02: 40 meq via ORAL
  Filled 2012-03-02: qty 2

## 2012-03-02 MED ORDER — LORATADINE 10 MG PO TABS
10.0000 mg | ORAL_TABLET | Freq: Every day | ORAL | Status: DC
  Administered 2012-03-02: 10 mg via ORAL
  Filled 2012-03-02: qty 1

## 2012-03-02 MED ORDER — ATORVASTATIN CALCIUM 40 MG PO TABS
40.0000 mg | ORAL_TABLET | Freq: Every day | ORAL | Status: DC
  Filled 2012-03-02: qty 1

## 2012-03-02 MED ORDER — ACETAMINOPHEN 325 MG PO TABS: 650.0000 mg | ORAL_TABLET | ORAL | Status: DC | PRN

## 2012-03-02 MED ORDER — IBUPROFEN 600 MG PO TABS
600.0000 mg | ORAL_TABLET | Freq: Three times a day (TID) | ORAL | Status: DC | PRN
  Administered 2012-03-02: 600 mg via ORAL
  Filled 2012-03-02: qty 1

## 2012-03-02 MED ORDER — VARENICLINE TARTRATE 1 MG PO TABS
1.0000 mg | ORAL_TABLET | Freq: Two times a day (BID) | ORAL | Status: DC
  Administered 2012-03-02: 1 mg via ORAL
  Filled 2012-03-02 (×2): qty 1

## 2012-03-02 MED ORDER — POTASSIUM CHLORIDE CRYS ER 20 MEQ PO TBCR
40.0000 meq | EXTENDED_RELEASE_TABLET | Freq: Once | ORAL | Status: AC
  Administered 2012-03-02: 40 meq via ORAL
  Filled 2012-03-02 (×2): qty 1

## 2012-03-02 NOTE — ED Notes (Signed)
Pt deciding if she still wants to seek treatment after procedures were explained, states she is going outside to make a phone call, resp even unlabored, skin pwd, family to monitor

## 2012-03-02 NOTE — ED Provider Notes (Addendum)
History     CSN: [id number]  Arrival date & time 03/01/12  2344   First MD Initiated Contact with Patient 03/02/12 0700      Chief Complaint  Patient presents with  . Medical Clearance    (Consider location/radiation/quality/duration/timing/severity/associated sxs/prior treatment) The history is provided by the patient. No language interpreter was used.   Pamela Howe is a 41 y.o. female history of postpartum depression here presenting with depression. Her mother died in 2022-12-03 of this year and since then she has been feeling very depressed. She just said that she hasn't been feeling well since her mother died. No suicidal ideation or homicidal ideation. Denies any auditory or visual hallucinations. No history of major depression but just postpartum depression and not on any psych meds. No fevers or chills and has been eating and sleeping well. She has occasional calf cramps but denies any recent travel history DVTs. Denies diarrhea or vomiting.     Past Medical History  Diagnosis Date  . Hypertension   . Neuropathy   . Arthritis   . Preeclampsia complicating hypertension   . Anxiety   . Postpartum depression   . Hernia     Past Surgical History  Procedure Date  . Nerve surgery     (R) hand  . Elbow surgery   . Hernia repair   . Uterine ablation     Family History  Problem Relation Age of Onset  . Cancer Mother   . Heart failure Father   . Hypertension Father     History  Substance Use Topics  . Smoking status: Current Every Day Smoker  . Smokeless tobacco: Not on file  . Alcohol Use: No    OB History    Grav Para Term Preterm Abortions TAB SAB Ect Mult Living   6 5 5  1  1   5       Review of Systems  Psychiatric/Behavioral: Positive for dysphoric mood. Negative for suicidal ideas, hallucinations and disturbed wake/sleep cycle.  All other systems reviewed and are negative.    Allergies  Doxycycline and Penicillins  Home Medications   Current  Outpatient Rx  Name Route Sig Dispense Refill  . ALBUTEROL SULFATE HFA 108 (90 BASE) MCG/ACT IN AERS Inhalation Inhale 2 puffs into the lungs every 6 (six) hours as needed. Shortness of breath    . ALPRAZOLAM 0.5 MG PO TABS Oral Take 0.25-0.5 mg by mouth at bedtime as needed. For anxiety    . CETIRIZINE HCL 10 MG PO TABS Oral Take 10 mg by mouth daily.    Marland Kitchen HYDROCHLOROTHIAZIDE 25 MG PO TABS Oral Take 25 mg by mouth daily.    Marland Kitchen ROSUVASTATIN CALCIUM 20 MG PO TABS Oral Take 20 mg by mouth daily.    . SERTRALINE HCL 100 MG PO TABS Oral Take 200 mg by mouth daily.    . SULFAMETHOXAZOLE-TMP DS 800-160 MG PO TABS Oral Take 1 tablet by mouth 2 (two) times daily.    . TRIAMCINOLONE ACETONIDE 55 MCG/ACT NA INHA Nasal Place 2 sprays into the nose daily as needed. allergies    . VARENICLINE TARTRATE 1 MG PO TABS Oral Take 1 mg by mouth 2 (two) times daily.    Marland Kitchen ZOLPIDEM TARTRATE 10 MG PO TABS Oral Take 10 mg by mouth at bedtime as needed. For sleep      BP 116/81  Pulse 91  Temp 98.2 F (36.8 C) (Oral)  Resp 16  Wt 120 lb (54.432 kg)  SpO2 98%  Physical Exam  Nursing note and vitals reviewed. Constitutional: She is oriented to person, place, and time. She appears well-developed and well-nourished.       Depressed   HENT:  Head: Normocephalic.  Mouth/Throat: Oropharynx is clear and moist.  Eyes: Conjunctivae normal are normal. Pupils are equal, round, and reactive to light.  Neck: Normal range of motion. Neck supple.  Cardiovascular: Normal rate, regular rhythm and normal heart sounds.   Pulmonary/Chest: Effort normal and breath sounds normal. No respiratory distress. She has no wheezes. She has no rales.  Abdominal: Soft. Bowel sounds are normal. She exhibits no distension. There is no tenderness.  Musculoskeletal: Normal range of motion. She exhibits no edema and no tenderness.  Neurological: She is alert and oriented to person, place, and time.  Skin: Skin is warm and dry.  Psychiatric:        Depressed mood, withdrawn. No suicidal or homicidal ideations.     ED Course  Procedures (including critical care time)  Labs Reviewed  CBC - Abnormal; Notable for the following:    WBC 14.3 (*)     RBC 5.22 (*)     Hemoglobin 16.0 (*)     MCHC 36.7 (*)     All other components within normal limits  COMPREHENSIVE METABOLIC PANEL - Abnormal; Notable for the following:    Sodium 131 (*)     Potassium 2.6 (*)     Chloride 90 (*)     Glucose, Bld 134 (*)     Total Protein 8.4 (*)     Total Bilirubin 0.2 (*)     All other components within normal limits  URINE RAPID DRUG SCREEN (HOSP PERFORMED) - Abnormal; Notable for the following:    Benzodiazepines POSITIVE (*)     All other components within normal limits  BASIC METABOLIC PANEL - Abnormal; Notable for the following:    Sodium 130 (*)     Potassium 3.3 (*)     Chloride 89 (*)     Glucose, Bld 104 (*)     All other components within normal limits  GLUCOSE, CAPILLARY - Abnormal; Notable for the following:    Glucose-Capillary 159 (*)     All other components within normal limits  ETHANOL  URINALYSIS, ROUTINE W REFLEX MICROSCOPIC   No results found.   No diagnosis found.    MDM  Pamela Howe is a 41 y.o. female here with depression. Labs showed K 2.6, was given potassium. Will recheck K. Will also get telepsych consult.    11:23 AM Repeat K 3.3. She is given another dose of potassium PO. ACT saw the patient. Patient is not suicidal or homicidal. Just want outpatient f/u. I canceled telepsych as she is stable for d/c. She is given outpatient referral. She also has been given bactrim for UTI and I recommend that she continues taking it.       Richardean Canal, MD 03/02/12 4540  Richardean Canal, MD 03/02/12 1125

## 2012-03-02 NOTE — BH Assessment (Signed)
Assessment Note   Pamela Howe is a 41 y.o. female who presents to with medical issues(potassium level, CBG issues) and depression.  Pt denies SI/HI/Psych.  Pt says she's been experiencing postpartum depression and depression from her mother's passing in 10/2011.  Pt says mom was dx with cancer in 07-20-2011 and passed away quickly.  Pt reports a strong bond with her mother--"She was my best friend and I was her everything, I have no one to talk to now".  Pt has no past hx of inpt/oupt mental health care and has not sought any grief therapy for mom's loss.  This Clinical research associate suggested therapy with local Hospice facility and additional therapists in the community.  Pt has 5 children, says spouse works a lot and feels overwhelmed at times with life. This Clinical research associate discussed case with Dr Silverio Lay, he agreed with disposition, pt will be monitored for potassium level and CBG and then d/c'd home.  Axis I: Depressive Disorder NOS Axis II: Deferred Axis III:  Past Medical History  Diagnosis Date  . Hypertension   . Neuropathy   . Arthritis   . Preeclampsia complicating hypertension   . Anxiety   . Postpartum depression   . Hernia    Axis IV: other psychosocial or environmental problems, problems related to social environment and problems with primary support group Axis V: 51-60 moderate symptoms  Past Medical History:  Past Medical History  Diagnosis Date  . Hypertension   . Neuropathy   . Arthritis   . Preeclampsia complicating hypertension   . Anxiety   . Postpartum depression   . Hernia     Past Surgical History  Procedure Date  . Nerve surgery     (R) hand  . Elbow surgery   . Hernia repair   . Uterine ablation     Family History:  Family History  Problem Relation Age of Onset  . Cancer Mother   . Heart failure Father   . Hypertension Father     Social History:  reports that she has been smoking.  She does not have any smokeless tobacco history on file. She reports that she does not  drink alcohol or use illicit drugs.  Additional Social History:  Alcohol / Drug Use Pain Medications: See Attached  Prescriptions: See Attached  Over the Counter: None  History of alcohol / drug use?: No history of alcohol / drug abuse Longest period of sobriety (when/how long): None  CIWA: CIWA-Ar BP: 113/74 mmHg Pulse Rate: 96  COWS:    Allergies:  Allergies  Allergen Reactions  . Doxycycline Nausea And Vomiting  . Penicillins     Home Medications:  (Not in a hospital admission)  OB/GYN Status:  No LMP recorded. Patient has had an ablation.  General Assessment Data Location of Assessment: [facility] Living Arrangements: Spouse/significant other;Children Can pt return to current living arrangement?: Yes Admission Status: Voluntary Is patient capable of signing voluntary admission?: Yes Transfer from: Acute Hospital Referral Source: MD  Education Status Is patient currently in school?: No Current Grade: None  Highest grade of school patient has completed: None  Name of school: None  Contact person: None   Risk to self Suicidal Ideation: No Suicidal Intent: No Is patient at risk for suicide?: No Suicidal Plan?: No Access to Means: No What has been your use of drugs/alcohol within the last 12 months?: Pt denies  Previous Attempts/Gestures: No How many times?: 0  Other Self Harm Risks: None  Triggers for Past Attempts: Unpredictable  Intentional Self Injurious Behavior: None Family Suicide History: No Recent stressful life event(s): Other (Comment);Loss (Comment) (Mother passed away in 18-Dec-2011; Postpartum) Persecutory voices/beliefs?: No Depression: Yes Depression Symptoms: Tearfulness;Fatigue;Loss of interest in usual pleasures;Feeling worthless/self pity;Insomnia Substance abuse history and/or treatment for substance abuse?: No Suicide prevention information given to non-admitted patients: Not applicable  Risk to Others Homicidal Ideation: No Thoughts of Harm  to Others: No Current Homicidal Intent: No Current Homicidal Plan: No Access to Homicidal Means: No Identified Victim: None  History of harm to others?: No Assessment of Violence: None Noted Violent Behavior Description: None  Does patient have access to weapons?: No Criminal Charges Pending?: No Does patient have a court date: No  Psychosis Hallucinations: None noted Delusions: None noted  Mental Status Report Appear/Hygiene: Other (Comment) (Appropriate ) Eye Contact: Good Motor Activity: Unremarkable Speech: Logical/coherent Level of Consciousness: Alert Mood: Depressed;Sad Affect: Depressed;Sad Anxiety Level: Minimal Thought Processes: Coherent;Relevant Judgement: Unimpaired Orientation: Person;Place;Time;Situation Obsessive Compulsive Thoughts/Behaviors: None  Cognitive Functioning Concentration: Normal Memory: Recent Intact;Remote Intact IQ: Average Insight: Good Impulse Control: Good Appetite: Good Weight Loss: 0  Weight Gain: 0  Sleep: Decreased Total Hours of Sleep: 4  Vegetative Symptoms: None  ADLScreening [facility] Assessment Services) Patient's cognitive ability adequate to safely complete daily activities?: Yes Patient able to express need for assistance with ADLs?: Yes Independently performs ADLs?: Yes (appropriate for developmental age)  Abuse/Neglect [facility]) Physical Abuse: Denies Verbal Abuse: Denies Sexual Abuse: Denies  Prior Inpatient Therapy Prior Inpatient Therapy: No Prior Therapy Dates: None  Prior Therapy Facilty/Provider(s): None  Reason for Treatment: None   Prior Outpatient Therapy Prior Outpatient Therapy: No Prior Therapy Dates: None  Prior Therapy Facilty/Provider(s): None  Reason for Treatment: None   ADL Screening (condition at time of admission) Patient's cognitive ability adequate to safely complete daily activities?: Yes Patient able to express need for assistance with ADLs?: Yes Independently performs ADLs?: Yes  (appropriate for developmental age) Weakness of Legs: None Weakness of Arms/Hands: None  Home Assistive Devices/Equipment Home Assistive Devices/Equipment: None  Therapy Consults (therapy consults require a physician order) PT Evaluation Needed: No OT Evalulation Needed: No SLP Evaluation Needed: No Abuse/Neglect Assessment (Assessment to be complete while patient is alone) Physical Abuse: Denies Verbal Abuse: Denies Sexual Abuse: Denies Exploitation of patient/patient's resources: Denies Self-Neglect: Denies Values / Beliefs Cultural Requests During Hospitalization: None Spiritual Requests During Hospitalization: None Consults Spiritual Care Consult Needed: No Social Work Consult Needed: No Merchant navy officer (For Healthcare) Advance Directive: Patient does not have advance directive;Patient would not like information Pre-existing out of facility DNR order (yellow form or pink MOST form): No Nutrition Screen- [facility] Patient's home diet: Carb modified Have you recently lost weight without trying?: No Have you been eating poorly because of a decreased appetite?: No Malnutrition Screening Tool Score: 0   Additional Information 1:1 In Past 12 Months?: No CIRT Risk: No Elopement Risk: No Does patient have medical clearance?: Yes     Disposition:  Disposition Disposition of Patient: Outpatient treatment;Referred to (Oupt referrals ) Type of outpatient treatment: Adult Patient referred to: Outpatient clinic referral  On Site Evaluation by:   Reviewed with Physician:     Murrell Redden 03/02/2012 10:11 AM

## 2012-03-02 NOTE — ED Notes (Signed)
Pt husband here to visit with patient. Belongings placed in locker via security.

## 2012-03-02 NOTE — ED Notes (Signed)
Pt was advised of discharge instructions, especially the re-check of her potassium level in one week. Pt and husband indicated full understanding of discharge instructions. Pt and husband was walked out by Clinical research associate to discharge window. Pt couldn't walk past the room in which her mother had been in here when she went into respiratory distress. Pt looking forward to going home and spending some time alone with her husband.

## 2012-03-02 NOTE — ED Notes (Signed)
ACT in w/ pt 

## 2012-03-02 NOTE — ED Notes (Signed)
Dr Isidoro Donning made aware of critical potassium of 2,6.  New orders given

## 2012-03-02 NOTE — ED Notes (Signed)
Dr Rosalia Hammers updated on pt status, pt moved to Psych ED per protocol

## 2012-03-02 NOTE — ED Notes (Signed)
Pt provided paper scrubs with instruction 

## 2012-03-02 NOTE — ED Notes (Signed)
ACT team CSW is in assessing the pt.

## 2012-03-02 NOTE — ED Notes (Signed)
Pt's husband is concerned about pt being discharged home. He's asked that Dr. Verlee Monte, an OB GYN is who recommended pt come to the ED after he saw her on Thursday. He always did some bloodwork that day as well. Husband is also concerned about all the medications she's on and if they are interacting with eachother. Husband reports that Dr Verlee Monte said he would come here and see pt today and asked if anyone of [place] have called him to speak to him about pt. Explained that the MD Tayvon may not have privileges here to see pt at our facility. Encouraged pt and husband to get a psychiatrist and allow them to monitor and prescribe her psychiatric medications and a family doctor to address the other issues. It's apparent that pt only trusts this Dr Verlee Monte for some reason. Explained the protocol to see if pt needs to be inpatient or outpatient and offered to allow husband to speak with the CSW Act rep that assessed his wife.   Pt is complaining of sweating and being dizzy or feeling off kilter. And wonders if that has something to do with her potassium being low. Her mother had problems with her potassium being low as well but pt has never been prescribed potassium as a home medication.

## 2012-03-02 NOTE — ED Notes (Signed)
Pt returns to Unit, presents with spouse, pt wanting to be evaluated, pt provided instructions on policy and plan of care to be provided, pt verbalizes understanding. Cont to monitor

## 2012-03-25 ENCOUNTER — Encounter: Payer: Self-pay | Admitting: Cardiovascular Disease

## 2012-03-26 ENCOUNTER — Institutional Professional Consult (permissible substitution): Payer: BC Managed Care – PPO | Admitting: Cardiovascular Disease

## 2013-04-16 ENCOUNTER — Encounter: Payer: Self-pay | Admitting: Physician Assistant

## 2013-04-16 DIAGNOSIS — F419 Anxiety disorder, unspecified: Secondary | ICD-10-CM | POA: Insufficient documentation

## 2013-04-16 DIAGNOSIS — R7303 Prediabetes: Secondary | ICD-10-CM | POA: Insufficient documentation

## 2013-04-16 DIAGNOSIS — I10 Essential (primary) hypertension: Secondary | ICD-10-CM | POA: Insufficient documentation

## 2013-04-16 DIAGNOSIS — E559 Vitamin D deficiency, unspecified: Secondary | ICD-10-CM | POA: Insufficient documentation

## 2013-04-18 ENCOUNTER — Encounter: Payer: Self-pay | Admitting: Physician Assistant

## 2013-04-18 ENCOUNTER — Ambulatory Visit (INDEPENDENT_AMBULATORY_CARE_PROVIDER_SITE_OTHER): Payer: BC Managed Care – PPO | Admitting: Physician Assistant

## 2013-04-18 VITALS — BP 122/82 | HR 80 | Temp 97.7°F | Resp 16 | Ht 60.0 in | Wt 139.0 lb

## 2013-04-18 DIAGNOSIS — R7309 Other abnormal glucose: Secondary | ICD-10-CM

## 2013-04-18 DIAGNOSIS — E782 Mixed hyperlipidemia: Secondary | ICD-10-CM

## 2013-04-18 DIAGNOSIS — R7303 Prediabetes: Secondary | ICD-10-CM

## 2013-04-18 DIAGNOSIS — I10 Essential (primary) hypertension: Secondary | ICD-10-CM

## 2013-04-18 DIAGNOSIS — F419 Anxiety disorder, unspecified: Secondary | ICD-10-CM

## 2013-04-18 DIAGNOSIS — R5381 Other malaise: Secondary | ICD-10-CM

## 2013-04-18 DIAGNOSIS — E559 Vitamin D deficiency, unspecified: Secondary | ICD-10-CM

## 2013-04-18 DIAGNOSIS — E785 Hyperlipidemia, unspecified: Secondary | ICD-10-CM

## 2013-04-18 DIAGNOSIS — Z79899 Other long term (current) drug therapy: Secondary | ICD-10-CM

## 2013-04-18 DIAGNOSIS — R21 Rash and other nonspecific skin eruption: Secondary | ICD-10-CM

## 2013-04-18 DIAGNOSIS — R079 Chest pain, unspecified: Secondary | ICD-10-CM

## 2013-04-18 LAB — CBC WITH DIFFERENTIAL/PLATELET
Eosinophils Absolute: 0 10*3/uL (ref 0.0–0.7)
Eosinophils Relative: 0 % (ref 0–5)
Lymphs Abs: 2.9 10*3/uL (ref 0.7–4.0)
MCH: 30.2 pg (ref 26.0–34.0)
MCV: 84.3 fL (ref 78.0–100.0)
Platelets: 277 10*3/uL (ref 150–400)
RBC: 5.16 MIL/uL — ABNORMAL HIGH (ref 3.87–5.11)

## 2013-04-18 LAB — HEPATIC FUNCTION PANEL
ALT: 10 U/L (ref 0–35)
AST: 13 U/L (ref 0–37)
Albumin: 4.6 g/dL (ref 3.5–5.2)
Total Protein: 7.3 g/dL (ref 6.0–8.3)

## 2013-04-18 LAB — HEMOGLOBIN A1C
Hgb A1c MFr Bld: 5.7 % — ABNORMAL HIGH (ref <5.7)
Mean Plasma Glucose: 117 mg/dL — ABNORMAL HIGH (ref <117)

## 2013-04-18 LAB — BASIC METABOLIC PANEL WITH GFR
BUN: 8 mg/dL (ref 6–23)
CO2: 18 mEq/L — ABNORMAL LOW (ref 19–32)
Calcium: 8.9 mg/dL (ref 8.4–10.5)
Creat: 0.54 mg/dL (ref 0.50–1.10)
Glucose, Bld: 104 mg/dL — ABNORMAL HIGH (ref 70–99)

## 2013-04-18 LAB — FERRITIN: Ferritin: 59 ng/mL (ref 10–291)

## 2013-04-18 LAB — LIPID PANEL: Cholesterol: 232 mg/dL — ABNORMAL HIGH (ref 0–200)

## 2013-04-18 LAB — VITAMIN B12: Vitamin B-12: 486 pg/mL (ref 211–911)

## 2013-04-18 MED ORDER — ATENOLOL 50 MG PO TABS: ORAL_TABLET | ORAL | Status: DC

## 2013-04-18 MED ORDER — HYDROCHLOROTHIAZIDE 25 MG PO TABS: 25.0000 mg | ORAL_TABLET | Freq: Every day | ORAL | Status: DC

## 2013-04-18 MED ORDER — NITROGLYCERIN 0.3 MG SL SUBL: 0.3000 mg | SUBLINGUAL_TABLET | SUBLINGUAL | Status: DC | PRN

## 2013-04-18 NOTE — Progress Notes (Signed)
HPI Patient presents for 3 month follow up with hypertension, hyperlipidemia, prediabetes and vitamin D. She has not been here since 2012.  Patient's mother pasted away last year and she went into a depression, she has been seeing Dr. Rosemary Holms but has been out of her medications for months.  Patient's blood pressure has no been controlled at home, she states at night her BP will go up to 182/94 with HR of 90's, today their BP is BP: 122/82 mmHg . She states that she has had chest tightness center of her chest with her heart racing, it has become more frequent 1-2 times daily, nonexertional, with SOB and diaphoresis, lasts 5-30 mins has taken her aunt's nitro and it has helped. Very strong family history of heart problems, early 35's. She has smoked for about 20-25 years. She states she can have SOB without any exertion, trouble getting full breath.  + GERD and water brash.  Patient's cholesterol is diet controlled. The cholesterol last visit was LDL 204. The patient has been working on diet and exercise for prediabetes, and denies changes in vision, polys, and paresthesias. A1C 5.7. Patient is on Vitamin D supplement.   Current Medications:  Current Outpatient Prescriptions on File Prior to Visit  Medication Sig Dispense Refill  . albuterol (PROVENTIL HFA;VENTOLIN HFA) 108 (90 BASE) MCG/ACT inhaler Inhale 2 puffs into the lungs every 6 (six) hours as needed. Shortness of breath      . ALPRAZolam (XANAX) 0.5 MG tablet Take 0.25-0.5 mg by mouth at bedtime as needed. For anxiety      . cetirizine (ZYRTEC) 10 MG tablet Take 10 mg by mouth daily.      . hydrochlorothiazide (HYDRODIURIL) 25 MG tablet Take 25 mg by mouth daily.      . sertraline (ZOLOFT) 100 MG tablet Take 200 mg by mouth daily.      Marland Kitchen triamcinolone (NASACORT) 55 MCG/ACT nasal inhaler Place 2 sprays into the nose daily as needed. allergies      . varenicline (CHANTIX) 1 MG tablet Take 1 mg by mouth 2 (two) times daily.      Marland Kitchen zolpidem  (AMBIEN) 10 MG tablet Take 10 mg by mouth at bedtime as needed. For sleep       No current facility-administered medications on file prior to visit.   Medical History:  Past Medical History  Diagnosis Date  . Hypertension   . Neuropathy   . Arthritis   . Preeclampsia complicating hypertension   . Anxiety   . Postpartum depression   . Hernia   . Prediabetes   . Vitamin D deficiency    Allergies:  Allergies  Allergen Reactions  . Doxycycline Nausea And Vomiting  . Penicillins     ROS Constitutional: + hot flashes Denies fever, chills, headaches, insomnia, fatigue, night sweats Eyes: Denies redness, blurred vision, diplopia, discharge, itchy, watery eyes.  ENT: Denies congestion, post nasal drip, sore throat, earache, dental pain, Tinnitus, Vertigo, Sinus pain, snoring.  Cardio: + chest pain, palpitations, diaphoresis  Dyspnea Denies irregular heartbeat, orthopnea, PND, claudication, edema Respiratory: denies cough, shortness of breath, wheezing.  Gastrointestinal: + GERD, water brash Denies dysphagia, AB pain/ cramps, N/V, diarrhea, constipation, hematemesis, melena, hematochezia,  hemorrhoids Genitourinary: Denies dysuria, frequency, urgency, nocturia, hesitancy, discharge, hematuria, flank pain Musculoskeletal: Denies myalgia, stiffness, pain, swelling and strain/sprain. Skin:  Rash above right ear for one year Denies pruritis, rash, changing in skin lesion Neuro: Denies Weakness, tremor, incoordination, spasms, pain Psychiatric: + stress/ depression Denies confusion, memory  loss, sensory loss Endocrine: Denies change in weight, skin, hair change, nocturia Diabetic Polys, Denies visual blurring, hyper /hypo glycemic episodes, and paresthesia, Heme/Lymph: Denies Excessive bleeding, bruising, enlarged lymph nodes  Family history-  Family History  Problem Relation Age of Onset  . Cancer Mother   . Heart failure Father   . Hypertension Father   . Diabetes Father   . Heart  disease Father    Social history- Review and unchanged Physical Exam: Filed Vitals:   04/18/13 1015  BP: 122/82  Pulse: 80  Temp: 97.7 F (36.5 C)  Resp: 16   Filed Weights   04/18/13 1015  Weight: 139 lb (63.05 kg)   General Appearance: Well nourished, in no apparent distress. Eyes: PERRLA, EOMs, conjunctiva no swelling or erythema Sinuses: No Frontal/maxillary tenderness ENT/Mouth: Ext aud canals clear, TMs without erythema, bulging. No erythema, swelling, or exudate on post pharynx.  Tonsils not swollen or erythematous. Hearing normal.  Neck: Supple, thyroid normal.  Respiratory: Respiratory effort normal, BS equal bilaterally without rales, rhonchi, wheezing or stridor.  Cardio: RRR with no MRGs. Brisk peripheral pulses without edema.  Abdomen: Soft, + BS.  + epicastric tenderness, no guarding, rebound, hernias, masses. Lymphatics: Non tender without lymphadenopathy.  Musculoskeletal: Full ROM, 5/5 strength, normal gait.  Skin: Warm, dry without rashes, lesions, ecchymosis. Right superior ear with nonhealing ulcer 6x6 mm.   Neuro: Cranial nerves intact. Normal muscle tone, no cerebellar symptoms. Sensation intact.  Psych: Awake and oriented X 3, normal affect, Insight and Judgment appropriate.   Assessment and Plan:  Hypertension: Continue medication, monitor blood pressure at home. Continue DASH diet. + atenolol at night, HCTZ as needs for swelling, and add 81mg  BASA Cholesterol: Continue diet and exercise. Check cholesterol.  Pre-diabetes-Continue diet and exercise. Check A1C Vitamin D Def- check level and continue medications.  Chest pain- atypical however very strong family history and several risk factors- will send to Cardio, EKG no ST changes, ? GERD- get on dexilant and check Hpylori, ? Vasospasm from smoking- + BASA, atenolol, smoking cessation, and NTG given. Right ear- send to Derm evaluate? SCC Smoking cessation- long discussion smoking cessation.  ? Menopause-  check FSH  More than 60 mins was spent with the patient  Continue diet and meds as discussed. Further disposition pending results of labs.  Quentin Mulling 10:33 AM

## 2013-04-18 NOTE — Patient Instructions (Addendum)
 Bad carbs also include fruit juice, alcohol, and sweet tea. These are empty calories that do not signal to your brain that you are full.   Please remember the good carbs are still carbs which convert into sugar. So please measure them out no more than 1/2-1 cup of rice, oatmeal, pasta, and beans.  Veggies are however free foods! Pile them on.   I like lean protein at every meal such as chicken, turkey, pork chops, cottage cheese, etc. Just do not fry these meats and please center your meal around vegetable, the meats should be a side dish.   No all fruit is created equal. Please see the list below, the fruit at the bottom is higher in sugars than the fruit at the top   Cholesterol Cholesterol is a white, waxy, fat-like protein needed by your body in small amounts. The liver makes all the cholesterol you need. It is carried from the liver by the blood through the blood vessels. Deposits (plaque) may build up on blood vessel walls. This makes the arteries narrower and stiffer. Plaque increases the risk for heart attack and stroke. You cannot feel your cholesterol level even if it is very high. The only way to know is by a blood test to check your lipid (fats) levels. Once you know your cholesterol levels, you should keep a record of the test results. Work with your caregiver to to keep your levels in the desired range. WHAT THE RESULTS MEAN:  Total cholesterol is a rough measure of all the cholesterol in your blood.  LDL is the so-called bad cholesterol. This is the type that deposits cholesterol in the walls of the arteries. You want this level to be low.  HDL is the good cholesterol because it cleans the arteries and carries the LDL away. You want this level to be high.  Triglycerides are fat that the body can either burn for energy or store. High levels are closely linked to heart disease. DESIRED LEVELS:  Total cholesterol below 200.  LDL below 100 for people at risk, below 70 for very  high risk.  HDL above 50 is good, above 60 is best.  Triglycerides below 150. HOW TO LOWER YOUR CHOLESTEROL:  Diet.  Choose fish or white meat chicken and turkey, roasted or baked. Limit fatty cuts of red meat, fried foods, and processed meats, such as sausage and lunch meat.  Eat lots of fresh fruits and vegetables. Choose whole grains, beans, pasta, potatoes and cereals.  Use only small amounts of olive, corn or canola oils. Avoid butter, mayonnaise, shortening or palm kernel oils. Avoid foods with trans-fats.  Use skim/nonfat milk and low-fat/nonfat yogurt and cheeses. Avoid whole milk, cream, ice cream, egg yolks and cheeses. Healthy desserts include angel food cake, ginger snaps, animal crackers, hard candy, popsicles, and low-fat/nonfat frozen yogurt. Avoid pastries, cakes, pies and cookies.  Exercise.  A regular program helps decrease LDL and raises HDL.  Helps with weight control.  Do things that increase your activity level like gardening, walking, or taking the stairs.  Medication.  May be prescribed by your caregiver to help lowering cholesterol and the risk for heart disease.  You may need medicine even if your levels are normal if you have several risk factors. HOME CARE INSTRUCTIONS   Follow your diet and exercise programs as suggested by your caregiver.  Take medications as directed.  Have blood work done when your caregiver feels it is necessary. MAKE SURE YOU:   Understand   these instructions.  Will watch your condition.  Will get help right away if you are not doing well or get worse. Document Released: 01/10/2001 Document Revised: 07/10/2011 Document Reviewed: 07/03/2007 [facility] Patient Information 2014 [place], [place].  HTN can do atenolol 1/2 -1 at night and can take the HCTZ as needed for swelling Take dexilant QD for 10 days  Smoking Cessation Quitting smoking is important to your health and has many advantages. However, it is not always easy  to quit since nicotine is a very addictive drug. Often times, people try 3 times or more before being able to quit. This document explains the best ways for you to prepare to quit smoking. Quitting takes hard work and a lot of effort, but you can do it. ADVANTAGES OF QUITTING SMOKING  You will live longer, feel better, and live better.  Your body will feel the impact of quitting smoking almost immediately.  Within 20 minutes, blood pressure decreases. Your pulse returns to its normal level.  After 8 hours, carbon monoxide levels in the blood return to normal. Your oxygen level increases.  After 24 hours, the chance of having a heart attack starts to decrease. Your breath, hair, and body stop smelling like smoke.  After 48 hours, damaged nerve endings begin to recover. Your sense of taste and smell improve.  After 72 hours, the body is virtually free of nicotine. Your bronchial tubes relax and breathing becomes easier.  After 2 to 12 weeks, lungs can hold more air. Exercise becomes easier and circulation improves.  The risk of having a heart attack, stroke, cancer, or lung disease is greatly reduced.  After 1 year, the risk of coronary heart disease is cut in half.  After 5 years, the risk of stroke falls to the same as a nonsmoker.  After 10 years, the risk of lung cancer is cut in half and the risk of other cancers decreases significantly.  After 15 years, the risk of coronary heart disease drops, usually to the level of a nonsmoker.  If you are pregnant, quitting smoking will improve your chances of having a healthy baby.  The people you live with, especially any children, will be healthier.  You will have extra money to spend on things other than cigarettes. QUESTIONS TO THINK ABOUT BEFORE ATTEMPTING TO QUIT You may want to talk about your answers with your caregiver.  Why do you want to quit?  If you tried to quit in the past, what helped and what did not?  What will be  the most difficult situations for you after you quit? How will you plan to handle them?  Who can help you through the tough times? Your family? Friends? A caregiver?  What pleasures do you get from smoking? What ways can you still get pleasure if you quit? Here are some questions to ask your caregiver:  How can you help me to be successful at quitting?  What medicine do you think would be best for me and how should I take it?  What should I do if I need more help?  What is smoking withdrawal like? How can I get information on withdrawal? GET READY  Set a quit date.  Change your environment by getting rid of all cigarettes, ashtrays, matches, and lighters in your home, car, or work. Do not let people smoke in your home.  Review your past attempts to quit. Think about what worked and what did not. GET SUPPORT AND ENCOURAGEMENT You have a better  chance of being successful if you have help. You can get support in many ways.  Tell your family, friends, and co-workers that you are going to quit and need their support. Ask them not to smoke around you.  Get individual, group, or telephone counseling and support. Programs are available at [employer] and health centers. Call your local health department for information about programs in your area.  Spiritual beliefs and practices may help some smokers quit.  Download a "quit meter" on your computer to keep track of quit statistics, such as how long you have gone without smoking, cigarettes not smoked, and money saved.  Get a self-help book about quitting smoking and staying off of tobacco. LEARN NEW SKILLS AND BEHAVIORS  Distract yourself from urges to smoke. Talk to someone, go for a walk, or occupy your time with a task.  Change your normal routine. Take a different route to work. Drink tea instead of coffee. Eat breakfast in a different place.  Reduce your stress. Take a hot bath, exercise, or read a book.  Plan something  enjoyable to do every day. Reward yourself for not smoking.  Explore interactive web-based programs that specialize in helping you quit. GET MEDICINE AND USE IT CORRECTLY Medicines can help you stop smoking and decrease the urge to smoke. Combining medicine with the above behavioral methods and support can greatly increase your chances of successfully quitting smoking.  Nicotine replacement therapy helps deliver nicotine to your body without the negative effects and risks of smoking. Nicotine replacement therapy includes nicotine gum, lozenges, inhalers, nasal sprays, and skin patches. Some may be available over-the-counter and others require a prescription.  Antidepressant medicine helps people abstain from smoking, but how this works is unknown. This medicine is available by prescription.  Nicotinic receptor partial agonist medicine simulates the effect of nicotine in your brain. This medicine is available by prescription. Ask your caregiver for advice about which medicines to use and how to use them based on your health history. Your caregiver will tell you what side effects to look out for if you choose to be on a medicine or therapy. Carefully read the information on the package. Do not use any other product containing nicotine while using a nicotine replacement product.  RELAPSE OR DIFFICULT SITUATIONS Most relapses occur within the first 3 months after quitting. Do not be discouraged if you start smoking again. Remember, most people try several times before finally quitting. You may have symptoms of withdrawal because your body is used to nicotine. You may crave cigarettes, be irritable, feel very hungry, cough often, get headaches, or have difficulty concentrating. The withdrawal symptoms are only temporary. They are strongest when you first quit, but they will go away within 10 14 days. To reduce the chances of relapse, try to:  Avoid drinking alcohol. Drinking lowers your chances of  successfully quitting.  Reduce the amount of caffeine you consume. Once you quit smoking, the amount of caffeine in your body increases and can give you symptoms, such as a rapid heartbeat, sweating, and anxiety.  Avoid smokers because they can make you want to smoke.  Do not let weight gain distract you. Many smokers will gain weight when they quit, usually less than 10 pounds. Eat a healthy diet and stay active. You can always lose the weight gained after you quit.  Find ways to improve your mood other than smoking. FOR MORE INFORMATION  www.smokefree.gov  Document Released: 04/11/2001 Document Revised: 10/17/2011 Document Reviewed: 07/27/2011 [employer]  Patient Information 2014 [place].

## 2013-04-19 LAB — VITAMIN D 25 HYDROXY (VIT D DEFICIENCY, FRACTURES): Vit D, 25-Hydroxy: 23 ng/mL — ABNORMAL LOW (ref 30–89)

## 2013-04-21 LAB — HELICOBACTER PYLORI ABS-IGG+IGA, BLD
H Pylori IgG: <0.4 {ISR}
HELICOBACTER PYLORI AB, IGA: 1.4 U/mL (ref <9.0)

## 2013-04-22 ENCOUNTER — Other Ambulatory Visit: Payer: Self-pay | Admitting: Physician Assistant

## 2013-04-22 MED ORDER — ATORVASTATIN CALCIUM 40 MG PO TABS: 40.0000 mg | ORAL_TABLET | Freq: Every day | ORAL | Status: DC

## 2013-05-05 ENCOUNTER — Ambulatory Visit (INDEPENDENT_AMBULATORY_CARE_PROVIDER_SITE_OTHER): Payer: BC Managed Care – PPO | Admitting: Cardiology

## 2013-05-05 ENCOUNTER — Encounter: Payer: Self-pay | Admitting: Cardiology

## 2013-05-05 VITALS — BP 122/80 | HR 76 | Ht 60.0 in | Wt 138.0 lb

## 2013-05-05 DIAGNOSIS — R0989 Other specified symptoms and signs involving the circulatory and respiratory systems: Secondary | ICD-10-CM

## 2013-05-05 DIAGNOSIS — R0600 Dyspnea, unspecified: Secondary | ICD-10-CM

## 2013-05-05 DIAGNOSIS — R079 Chest pain, unspecified: Secondary | ICD-10-CM

## 2013-05-05 DIAGNOSIS — R0609 Other forms of dyspnea: Secondary | ICD-10-CM

## 2013-05-05 DIAGNOSIS — R42 Dizziness and giddiness: Secondary | ICD-10-CM

## 2013-05-05 DIAGNOSIS — E785 Hyperlipidemia, unspecified: Secondary | ICD-10-CM

## 2013-05-05 DIAGNOSIS — I10 Essential (primary) hypertension: Secondary | ICD-10-CM

## 2013-05-05 DIAGNOSIS — E782 Mixed hyperlipidemia: Secondary | ICD-10-CM | POA: Insufficient documentation

## 2013-05-05 MED ORDER — LISINOPRIL 20 MG PO TABS: 20.0000 mg | ORAL_TABLET | Freq: Every day | ORAL | Status: DC

## 2013-05-05 NOTE — Patient Instructions (Addendum)
Your physician has recommended you make the following change in your medication:  1) STOP Atenolol 2)START Lisinopril 20mg  daily  Your physician recommends that you return for a FASTING NMR lipid profile, Lipoprotein A   Your physician has requested that you have an echocardiogram. Echocardiography is a painless test that uses sound waves to create images of your heart. It provides your doctor with information about the size and shape of your heart and how well your heart's chambers and valves are working. This procedure takes approximately one hour. There are no restrictions for this procedure.  Your physician has requested that you have a stress echocardiogram. For further information please visit [email or web address]. Please follow instruction sheet as given.  Your physician has requested that you have a carotid duplex. This test is an ultrasound of the carotid arteries in your neck. It looks at blood flow through these arteries that supply the brain with blood. Allow one hour for this exam. There are no restrictions or special instructions.  Your physician recommends that you schedule a follow-up appointment after testing is completed

## 2013-05-05 NOTE — Progress Notes (Signed)
Patient ID: Arnecia H Borelli, female   DOB: [birth year], 43 y.o.   MRN: [id number]     Patient Name: Pamela Howe of Encounter: 05/05/2013  Primary Care Provider:  Nadean CorwinMCKEOWN,WILLIAM DAVID, MD Primary Cardiologist:  Tobias AlexanderNELSON, Cadey Bazile, H  Problem List   Past Medical History  Diagnosis Date  . Hypertension   . Neuropathy   . Arthritis   . Preeclampsia complicating hypertension   . Anxiety   . Postpartum depression   . Hernia   . Prediabetes   . Vitamin D deficiency    Past Surgical History  Procedure Laterality Date  . Nerve surgery      (R) hand  . Elbow surgery    . Hernia repair    . Uterine ablation     Allergies  Allergies  Allergen Reactions  . Doxycycline Nausea And Vomiting  . Penicillins    HPI  43 year old female with h/o hypertension, hyperlipidemia and prediabetes who is being referred to us for chest pain. She went through emotionally difficult times the last year and states that her BP woulld be up to 200 mmHg. She states that she is not involved in any sports but is very active at home taking care of 5 children and a disabled aunt.  She has been experiencing episodes of exertional dyspnea associated with chest pressure with minimal activity. It is retrosternal and resolves with NTG after 5 minutes. She also complains of episodic dizziness. She is very concerned as multiple members of her family have CAD< her father had CABG x 4 at age 44, her aunt had MI in her 3540'. She denies smoking. No orthopnea, PND, LE edema. She also complains of restless leg syndrome and headache since she started to take atenolol.   Home Medications  Prior to Admission medications   Medication Sig Start Date End Date Taking? Authorizing Provider  albuterol (PROVENTIL HFA;VENTOLIN HFA) 108 (90 BASE) MCG/ACT inhaler Inhale 2 puffs into the lungs every 6 (six) hours as needed. Shortness of breath   Yes Historical Provider, MD  ALPRAZolam (XANAX) 0.5 MG tablet Take 0.25-0.5 mg by mouth  at bedtime as needed. For anxiety   Yes Historical Provider, MD  atenolol (TENORMIN) 50 MG tablet 1/2-1 pill at night for BP 04/18/13 05/29/13 Yes Quentin MullingAmanda Collier, PA-C  atorvastatin (LIPITOR) 40 MG tablet Take 1 tablet (40 mg total) by mouth daily. 04/22/13 04/22/14 Yes Quentin MullingAmanda Collier, PA-C  cetirizine (ZYRTEC) 10 MG tablet Take 10 mg by mouth daily.   Yes Historical Provider, MD  hydrochlorothiazide (HYDRODIURIL) 25 MG tablet Take 1 tablet (25 mg total) by mouth daily. 04/18/13  Yes Quentin MullingAmanda Collier, PA-C  nitroGLYCERIN (NITROSTAT) 0.3 MG SL tablet Place 1 tablet (0.3 mg total) under the tongue every 5 (five) minutes as needed for chest pain. 04/18/13 04/18/14 Yes Quentin MullingAmanda Collier, PA-C  sertraline (ZOLOFT) 100 MG tablet Take 200 mg by mouth daily.   Yes Historical Provider, MD  triamcinolone (NASACORT) 55 MCG/ACT nasal inhaler Place 2 sprays into the nose daily as needed. allergies   Yes Historical Provider, MD  zolpidem (AMBIEN) 10 MG tablet Take 10 mg by mouth at bedtime as needed. For sleep   Yes Historical Provider, MD  varenicline (CHANTIX) 1 MG tablet Take 1 mg by mouth 2 (two) times daily.    Historical Provider, MD    Family History  Family History  Problem Relation Age of Onset  . Cancer Mother   . Heart failure Father   . Hypertension Father   .  Diabetes Father   . Heart disease Father   . Heart attack Father   . Hyperlipidemia Father   . Hypertension Mother   . Hyperlipidemia Mother     Social History  History   Social History  . Marital Status: Married    Spouse Name: N/A    Number of Children: N/A  . Years of Education: N/A   Occupational History  . Not on file.   Social History Main Topics  . Smoking status: Current Every Day Smoker  . Smokeless tobacco: Never Used  . Alcohol Use: No  . Drug Use: No  . Sexual Activity: Yes    Birth Control/ Protection: Other-see comments     Comment: spouse had a vasectomy   Other Topics Concern  . Not on file   Social  History Narrative  . No narrative on file     Review of Systems, as per HPI, otherwise negative General:  No chills, fever, night sweats or weight changes.  Cardiovascular:  No chest pain, dyspnea on exertion, edema, orthopnea, palpitations, paroxysmal nocturnal dyspnea. Dermatological: No rash, lesions/masses Respiratory: No cough, dyspnea Urologic: No hematuria, dysuria Abdominal:   No nausea, vomiting, diarrhea, bright red blood per rectum, melena, or hematemesis Neurologic:  No visual changes, wkns, changes in mental status. All other systems reviewed and are otherwise negative except as noted above.  Physical Exam  Blood pressure 122/80, pulse 76, height 5' (1.524 m), weight 138 lb (62.596 kg).  General: Pleasant, NAD Psych: Normal affect. Neuro: Alert and oriented X 3. Moves all extremities spontaneously. HEENT: Normal  Neck: Supple without bruits or JVD. Lungs:  Resp regular and unlabored, CTA. Heart: RRR no s3, s4, or murmurs. Abdomen: Soft, non-tender, non-distended, BS + x 4.  Extremities: No clubbing, cyanosis or edema. DP/PT/Radials 2+ and equal bilaterally.  Labs:  No results found for this basename: CKTOTAL, CKMB, TROPONINI,  in the last 72 hours Lab Results  Component Value Date   WBC 9.0 04/18/2013   HGB 15.6* 04/18/2013   HCT 43.5 04/18/2013   MCV 84.3 04/18/2013   PLT 277 04/18/2013   No results found for this basename: NA, K, CL, CO2, BUN, CREATININE, CALCIUM, LABALBU, PROT, BILITOT, ALKPHOS, ALT, AST, GLUCOSE,  in the last 168 hours Lab Results  Component Value Date   CHOL 232* 04/18/2013   HDL 35* 04/18/2013   LDLCALC 166* 04/18/2013   TRIG 155* 04/18/2013    Accessory Clinical Findings  Echocardiogram - none  ECG - SR, normal ECG   Assessment & Plan  43 year old female   1. Typical exertional chest pain - risk factors include hyperlipidemia and family h/o CAD. We will order an exercise stress echocardiogram to evaluate for functional  capacity, symptoms and ischemia and BP response during exercise.  2. Hypertension - we will switch atenolol 50 mg po daily for lisinopril 20 mg po daily. She will bring a diary of morning and evening BP measurements.   3. Hyperlipidemia - not at goal, on atorvastatin only 3 x weekly, with family history of CAD in multiple family members we will order NMR and LPa and refer her to the lipid clinic.  4. Dizziness - we will order carotid US  Follow up in 1 month  Lars Masson, MD, [facility] 05/05/2013, 3:35 PM

## 2013-05-07 ENCOUNTER — Telehealth: Payer: Self-pay

## 2013-05-07 NOTE — Telephone Encounter (Signed)
new pt appt made in the lipid clinic for 05/23/13 @4pm .pt to arrive at 3:45pm to ck in. pt aware and verbalized understanding.

## 2013-05-07 NOTE — Telephone Encounter (Signed)
Message copied by Jarvis NewcomerPARRIS-GODLEY, Yoshi Vicencio S on Wed May 07, 2013  2:25 PM ------      Message from: Lars MassonNELSON, KATARINA H      Created: Mon May 05, 2013  5:54 PM       Larita FifeLynn,      Could you refer this patient to the lipid clinic to be seen in about two weeks from now?      Thank you,      Aris LotKatarina ------

## 2013-05-08 ENCOUNTER — Other Ambulatory Visit: Payer: BC Managed Care – PPO

## 2013-05-08 ENCOUNTER — Encounter: Payer: Self-pay | Admitting: Cardiology

## 2013-05-08 ENCOUNTER — Ambulatory Visit (HOSPITAL_COMMUNITY): Payer: BC Managed Care – PPO | Attending: Cardiology

## 2013-05-08 DIAGNOSIS — I6529 Occlusion and stenosis of unspecified carotid artery: Secondary | ICD-10-CM

## 2013-05-08 DIAGNOSIS — I10 Essential (primary) hypertension: Secondary | ICD-10-CM | POA: Insufficient documentation

## 2013-05-08 DIAGNOSIS — F17200 Nicotine dependence, unspecified, uncomplicated: Secondary | ICD-10-CM | POA: Insufficient documentation

## 2013-05-08 DIAGNOSIS — R42 Dizziness and giddiness: Secondary | ICD-10-CM

## 2013-05-08 DIAGNOSIS — E785 Hyperlipidemia, unspecified: Secondary | ICD-10-CM

## 2013-05-09 LAB — NMR LIPOPROFILE WITH LIPIDS
Cholesterol, Total: 165 mg/dL (ref <200)
HDL Particle Number: 26.1 umol/L — ABNORMAL LOW (ref >=30.5)
HDL Size: 8.6 nm — ABNORMAL LOW (ref >=9.2)
HDL-C: 34 mg/dL — ABNORMAL LOW (ref >=40)
LDL (calc): 104 mg/dL — ABNORMAL HIGH (ref <100)
LDL Particle Number: 1822 nmol/L — ABNORMAL HIGH (ref <1000)
LDL Size: 19.8 nm — ABNORMAL LOW (ref >20.5)
LP-IR Score: 73 — ABNORMAL HIGH (ref <=45)
Large HDL-P: 1.6 umol/L — ABNORMAL LOW (ref >=4.8)
Large VLDL-P: 3 nmol/L — ABNORMAL HIGH (ref <=2.7)
Small LDL Particle Number: 1415 nmol/L — ABNORMAL HIGH (ref <=527)
Triglycerides: 133 mg/dL (ref <150)
VLDL Size: 46.1 nm (ref <=46.6)

## 2013-05-09 LAB — LIPOPROTEIN A (LPA): Lipoprotein (a): 52 mg/dL — ABNORMAL HIGH (ref 0–30)

## 2013-05-21 ENCOUNTER — Other Ambulatory Visit: Payer: Self-pay | Admitting: Physician Assistant

## 2013-05-21 MED ORDER — ALPRAZOLAM 0.5 MG PO TABS: 0.2500 mg | ORAL_TABLET | Freq: Every evening | ORAL | Status: DC | PRN

## 2013-05-22 ENCOUNTER — Encounter: Payer: Self-pay | Admitting: Physician Assistant

## 2013-05-22 ENCOUNTER — Ambulatory Visit (INDEPENDENT_AMBULATORY_CARE_PROVIDER_SITE_OTHER): Payer: BC Managed Care – PPO | Admitting: Physician Assistant

## 2013-05-22 VITALS — BP 122/72 | HR 84 | Temp 98.7°F | Resp 16 | Wt 141.0 lb

## 2013-05-22 DIAGNOSIS — E782 Mixed hyperlipidemia: Secondary | ICD-10-CM

## 2013-05-22 DIAGNOSIS — J329 Chronic sinusitis, unspecified: Secondary | ICD-10-CM

## 2013-05-22 DIAGNOSIS — I10 Essential (primary) hypertension: Secondary | ICD-10-CM

## 2013-05-22 DIAGNOSIS — E785 Hyperlipidemia, unspecified: Secondary | ICD-10-CM

## 2013-05-22 DIAGNOSIS — K219 Gastro-esophageal reflux disease without esophagitis: Secondary | ICD-10-CM

## 2013-05-22 LAB — CBC WITH DIFFERENTIAL/PLATELET
BASOS ABS: 0 10*3/uL (ref 0.0–0.1)
Basophils Relative: 0 % (ref 0–1)
Eosinophils Absolute: 0.1 10*3/uL (ref 0.0–0.7)
Eosinophils Relative: 0 % (ref 0–5)
HEMATOCRIT: 45.4 % (ref 36.0–46.0)
Hemoglobin: 16.2 g/dL — ABNORMAL HIGH (ref 12.0–15.0)
LYMPHS PCT: 29 % (ref 12–46)
Lymphs Abs: 3.7 10*3/uL (ref 0.7–4.0)
MCH: 30.6 pg (ref 26.0–34.0)
MCHC: 35.7 g/dL (ref 30.0–36.0)
MCV: 85.8 fL (ref 78.0–100.0)
MONO ABS: 0.6 10*3/uL (ref 0.1–1.0)
Monocytes Relative: 5 % (ref 3–12)
NEUTROS ABS: 8.6 10*3/uL — AB (ref 1.7–7.7)
Neutrophils Relative %: 66 % (ref 43–77)
PLATELETS: 362 10*3/uL (ref 150–400)
RBC: 5.29 MIL/uL — AB (ref 3.87–5.11)
RDW: 14 % (ref 11.5–15.5)
WBC: 12.9 10*3/uL — AB (ref 4.0–10.5)

## 2013-05-22 MED ORDER — AZITHROMYCIN 250 MG PO TABS: ORAL_TABLET | ORAL | Status: AC

## 2013-05-22 MED ORDER — PANTOPRAZOLE SODIUM 40 MG PO TBEC: 40.0000 mg | DELAYED_RELEASE_TABLET | Freq: Every day | ORAL | Status: DC

## 2013-05-22 MED ORDER — LOSARTAN POTASSIUM 100 MG PO TABS: 100.0000 mg | ORAL_TABLET | Freq: Every day | ORAL | Status: DC

## 2013-05-22 NOTE — Patient Instructions (Signed)
Stop lisinopril start Losartan 1/2 pill daily and check blood pressure go up to a whole pill if BP greater than 140/70 Take the protonix daily 30 mins before food Increase Lipitor to everyday  Cholesterol Cholesterol is a white, waxy, fat-like protein needed by your body in small amounts. The liver makes all the cholesterol you need. It is carried from the liver by the blood through the blood vessels. Deposits (plaque) may build up on blood vessel walls. This makes the arteries narrower and stiffer. Plaque increases the risk for heart attack and stroke. You cannot feel your cholesterol level even if it is very high. The only way to know is by a blood test to check your lipid (fats) levels. Once you know your cholesterol levels, you should keep a record of the test results. Work with your caregiver to to keep your levels in the desired range. WHAT THE RESULTS MEAN:  Total cholesterol is a rough measure of all the cholesterol in your blood.  LDL is the so-called bad cholesterol. This is the type that deposits cholesterol in the walls of the arteries. You want this level to be low.  HDL is the good cholesterol because it cleans the arteries and carries the LDL away. You want this level to be high.  Triglycerides are fat that the body can either burn for energy or store. High levels are closely linked to heart disease. DESIRED LEVELS:  Total cholesterol below 200.  LDL below 100 for people at risk, below 70 for very high risk.  HDL above 50 is good, above 60 is best.  Triglycerides below 150. HOW TO LOWER YOUR CHOLESTEROL:  Diet.  Choose fish or white meat chicken and [place], roasted or baked. Limit fatty cuts of red meat, fried foods, and processed meats, such as sausage and lunch meat.  Eat lots of fresh fruits and vegetables. Choose whole grains, beans, pasta, potatoes and cereals.  Use only small amounts of olive, corn or canola oils. Avoid butter, mayonnaise, shortening or palm  kernel oils. Avoid foods with trans-fats.  Use skim/nonfat milk and low-fat/nonfat yogurt and cheeses. Avoid whole milk, cream, ice cream, egg yolks and cheeses. Healthy desserts include angel food cake, ginger snaps, animal crackers, hard candy, popsicles, and low-fat/nonfat frozen yogurt. Avoid pastries, cakes, pies and cookies.  Exercise.  A regular program helps decrease LDL and raises HDL.  Helps with weight control.  Do things that increase your activity level like gardening, walking, or taking the stairs.  Medication.  May be prescribed by your caregiver to help lowering cholesterol and the risk for heart disease.  You may need medicine even if your levels are normal if you have several risk factors. HOME CARE INSTRUCTIONS   Follow your diet and exercise programs as suggested by your caregiver.  Take medications as directed.  Have blood work done when your caregiver feels it is necessary. MAKE SURE YOU:   Understand these instructions.  Will watch your condition.  Will get help right away if you are not doing well or get worse. Document Released: 01/10/2001 Document Revised: 07/10/2011 Document Reviewed: 01/29/2013 [facility] Patient Information 2014 [place], [place].  We are giving you chantix for smoking cessation. You can do it! And we are here to help! You may have heard some scary side effects about chantix, the three most common I hear about are nausea, crazy dreams and depression.  However, I like for my patients to try to stay on 1/2 a tablet twice a day rather  than one tablet twice a day as normally prescribed. This helps decrease the chances of side effects and helps save money by making a one month prescription last two months  Please start the prescription this way:  Start 1/2 tablet by mouth once daily after food with a full glass of water for 3 days Then do 1/2 tablet by mouth twice daily for 4 days.  At this point we have several options: 1) continue on  1/2 tablet twice a day- which I encourage you to do. You can stay on this dose the rest of the time on the medication or if you still feel the need to smoke you can do one of the two options below. 2) do one tablet in the morning and 1/2 in the evening which helps decrease dreams. 3) do one tablet twice a day.   What if I miss a dose? If you miss a dose, take it as soon as you can. If it is almost time for your next dose, take only that dose. Do not take double or extra doses.  What should I watch for while using this medicine? Visit your doctor or health care professional for regular check ups. Ask for ongoing advice and encouragement from your doctor or healthcare professional, friends, and family to help you quit. If you smoke while on this medication, quit again  Your mouth may get dry. Chewing sugarless gum or hard candy, and drinking plenty of water may help. Contact your doctor if the problem does not go away or is severe.  You may get drowsy or dizzy. Do not drive, use machinery, or do anything that needs mental alertness until you know how this medicine affects you. Do not stand or sit up quickly, especially if you are an older patient.   The use of this medicine may increase the chance of suicidal thoughts or actions. Pay special attention to how you are responding while on this medicine. Any worsening of mood, or thoughts of suicide or dying should be reported to your health care professional right away.  ADVANTAGES OF QUITTING SMOKING  Within 20 minutes, blood pressure decreases. Your pulse is at normal level.  After 8 hours, carbon monoxide levels in the blood return to normal. Your oxygen level increases.  After 24 hours, the chance of having a heart attack starts to decrease. Your breath, hair, and body stop smelling like smoke.  After 48 hours, damaged nerve endings begin to recover. Your sense of taste and smell improve.  After 72 hours, the body is virtually free of  nicotine. Your bronchial tubes relax and breathing becomes easier.  After 2 to 12 weeks, lungs can hold more air. Exercise becomes easier and circulation improves.  After 1 year, the risk of coronary heart disease is cut in half.  After 5 years, the risk of stroke falls to the same as a nonsmoker.  After 10 years, the risk of lung cancer is cut in half and the risk of other cancers decreases significantly.  After 15 years, the risk of coronary heart disease drops, usually to the level of a nonsmoker.  You will have extra money to spend on things other than cigarettes.

## 2013-05-22 NOTE — Progress Notes (Signed)
HPI Patient presents for a one month follow up. Patient continues to have palpitations and chest pain, she was sent to Dr. Delton See in cardiology and has a stress echo scheduled for tomorrow, has her set up to see the lipid clinic due to her strong family history, she also switched her atenolol due to headaches to lisinopril which she now has a dry cough with but otherwise tolerating well.  She was recently started on Lipitor TID and denies myalgias, she had advanced testing which showed that her LDL was better but her LDL and particle number is not at goal. She also has nausea, GI problems, sinus headache, fever, got amoxicillin from urgent care and not helping.   BP Readings from Last 3 Encounters:  05/22/13 122/72  05/05/13 122/80  04/18/13 122/82   Lab Results  Component Value Date   CHOL 232* 04/18/2013   HDL 35* 04/18/2013   LDLCALC 104* 05/08/2013   TRIG 133 05/08/2013   CHOLHDL 6.6 04/18/2013    Past Medical History  Diagnosis Date  . Hypertension   . Neuropathy   . Arthritis   . Preeclampsia complicating hypertension   . Anxiety   . Postpartum depression   . Hernia   . Prediabetes   . Vitamin D deficiency      Allergies  Allergen Reactions  . Doxycycline Nausea And Vomiting  . Penicillins       Current Outpatient Prescriptions on File Prior to Visit  Medication Sig Dispense Refill  . albuterol (PROVENTIL HFA;VENTOLIN HFA) 108 (90 BASE) MCG/ACT inhaler Inhale 2 puffs into the lungs every 6 (six) hours as needed. Shortness of breath      . ALPRAZolam (XANAX) 0.5 MG tablet Take 0.5-1 tablets (0.25-0.5 mg total) by mouth at bedtime as needed. For anxiety  30 tablet  0  . atorvastatin (LIPITOR) 40 MG tablet Take 1 tablet (40 mg total) by mouth daily.  30 tablet  2  . cetirizine (ZYRTEC) 10 MG tablet Take 10 mg by mouth daily.      . hydrochlorothiazide (HYDRODIURIL) 25 MG tablet Take 1 tablet (25 mg total) by mouth daily.  30 tablet  2  . lisinopril (PRINIVIL,ZESTRIL) 20 MG  tablet Take 1 tablet (20 mg total) by mouth daily.  30 tablet  11  . nitroGLYCERIN (NITROSTAT) 0.3 MG SL tablet Place 1 tablet (0.3 mg total) under the tongue every 5 (five) minutes as needed for chest pain.  25 tablet  1  . sertraline (ZOLOFT) 100 MG tablet Take 200 mg by mouth daily.      Marland Kitchen triamcinolone (NASACORT) 55 MCG/ACT nasal inhaler Place 2 sprays into the nose daily as needed. allergies      . varenicline (CHANTIX) 1 MG tablet Take 1 mg by mouth 2 (two) times daily.      Marland Kitchen zolpidem (AMBIEN) 10 MG tablet Take 10 mg by mouth at bedtime as needed. For sleep       No current facility-administered medications on file prior to visit.    ROS: all negative expect above.   Physical: Filed Weights   05/22/13 1541  Weight: 141 lb (63.957 kg)   Filed Vitals:   05/22/13 1541  BP: 122/72  Pulse: 84  Temp: 98.7 F (37.1 C)  Resp: 16   General Appearance: Well nourished, in no apparent distress. Eyes: PERRLA, EOMs. Sinuses: + Frontal/maxillary tenderness ENT/Mouth: Ext aud canals clear, normal light reflex with TMs without erythema, bulging. Post pharynx without erythema, swelling, exudate.  Respiratory: CTAB  Cardio: RRR, no murmurs, rubs or gallops. Peripheral pulses brisk and equal bilaterally, without edema. No aortic or femoral bruits. Abdomen: Soft, with bowl sounds. Epigastric tender, no guarding, rebound. Lymphatics: Non tender without lymphadenopathy.  Musculoskeletal: Full ROM all peripheral extremities, 5/5 strength, and normal gait. Skin: Warm, dry without rashes, lesions, ecchymosis.  Neuro: Cranial nerves intact, reflexes equal bilaterally. Normal muscle tone, no cerebellar symptoms. Sensation intact.  Pysch: Awake and oriented X 3, normal affect, Insight and Judgment appropriate.   Assessment and Plan: 1) HTN- cough with ACE will switch to Losartan 100 start 1/2 daily and may go up to one pill 2) Chest pain- strong family history and several risk factors- getting  stress echo tomorrow 3) Chosterol- not at goal, agree with advanced lipids testing, increase lipitor and check LFTs today 4) smoking cessation- start chantix after stress echo/medications are more stable 5) GERD- will call in protonix 6) Sinusitis- Zpak

## 2013-05-23 ENCOUNTER — Telehealth: Payer: Self-pay

## 2013-05-23 ENCOUNTER — Ambulatory Visit: Payer: BC Managed Care – PPO | Admitting: Cardiovascular Disease

## 2013-05-23 ENCOUNTER — Ambulatory Visit (HOSPITAL_BASED_OUTPATIENT_CLINIC_OR_DEPARTMENT_OTHER): Payer: BC Managed Care – PPO

## 2013-05-23 ENCOUNTER — Ambulatory Visit (HOSPITAL_COMMUNITY): Payer: BC Managed Care – PPO | Attending: Cardiology | Admitting: Cardiology

## 2013-05-23 DIAGNOSIS — I10 Essential (primary) hypertension: Secondary | ICD-10-CM | POA: Insufficient documentation

## 2013-05-23 DIAGNOSIS — R079 Chest pain, unspecified: Secondary | ICD-10-CM | POA: Insufficient documentation

## 2013-05-23 DIAGNOSIS — R0609 Other forms of dyspnea: Secondary | ICD-10-CM | POA: Insufficient documentation

## 2013-05-23 DIAGNOSIS — R0989 Other specified symptoms and signs involving the circulatory and respiratory systems: Secondary | ICD-10-CM | POA: Insufficient documentation

## 2013-05-23 LAB — HEPATIC FUNCTION PANEL
ALT: 15 U/L (ref 0–35)
AST: 17 U/L (ref 0–37)
Albumin: 4.8 g/dL (ref 3.5–5.2)
Alkaline Phosphatase: 93 U/L (ref 39–117)
BILIRUBIN DIRECT: 0.1 mg/dL (ref 0.0–0.3)
BILIRUBIN TOTAL: 0.4 mg/dL (ref 0.3–1.2)
Indirect Bilirubin: 0.3 mg/dL (ref 0.0–0.9)
Total Protein: 7.8 g/dL (ref 6.0–8.3)

## 2013-05-23 LAB — BASIC METABOLIC PANEL WITH GFR
BUN: 11 mg/dL (ref 6–23)
CHLORIDE: 92 meq/L — AB (ref 96–112)
CO2: 26 mEq/L (ref 19–32)
Calcium: 9.5 mg/dL (ref 8.4–10.5)
Creat: 0.75 mg/dL (ref 0.50–1.10)
GFR, Est African American: >89 mL/min
Glucose, Bld: 94 mg/dL (ref 70–99)
POTASSIUM: 4.9 meq/L (ref 3.5–5.3)
Sodium: 128 mEq/L — ABNORMAL LOW (ref 135–145)

## 2013-05-23 NOTE — Progress Notes (Signed)
Echo performed. 

## 2013-05-23 NOTE — Telephone Encounter (Signed)
Message copied by Joya MartyrZMENT, Jabri Blancett M on Fri May 23, 2013  9:07 AM ------      Message from: Quentin MullingOLLIER, AMANDA R      Created: Fri May 23, 2013  8:30 AM       Please stop your HCTZ for now, and decrease your zoloft to 100mg  daily rather than 200mg  daily. Please decrease your fluid intake slightly. Your sodium is very low and both of these medications can contribute to that. We will need to recheck this in one week. I'm sending these labs and notes to your Cardiologist. WBC is elevated so please finish the ABX given. ------

## 2013-05-23 NOTE — Progress Notes (Signed)
Stress echo performed. 

## 2013-05-23 NOTE — Telephone Encounter (Signed)
lmom to pt to return my call to review lab results.

## 2013-05-26 ENCOUNTER — Telehealth: Payer: Self-pay | Admitting: Cardiology

## 2013-05-26 ENCOUNTER — Ambulatory Visit: Payer: BC Managed Care – PPO | Admitting: Pharmacist

## 2013-05-26 NOTE — Telephone Encounter (Signed)
Informed pt of echo and carotid doppler results.

## 2013-05-26 NOTE — Telephone Encounter (Signed)
New Problem:  Pt is calling to hear recent test results. Pt would like a call back.

## 2013-05-27 ENCOUNTER — Other Ambulatory Visit: Payer: Self-pay | Admitting: Physician Assistant

## 2013-05-29 ENCOUNTER — Ambulatory Visit: Payer: BC Managed Care – PPO | Admitting: Cardiology

## 2013-05-29 ENCOUNTER — Ambulatory Visit: Payer: BC Managed Care – PPO | Admitting: Pharmacist

## 2013-05-30 ENCOUNTER — Telehealth: Payer: Self-pay | Admitting: Pharmacist

## 2013-05-30 DIAGNOSIS — Z79899 Other long term (current) drug therapy: Secondary | ICD-10-CM

## 2013-05-30 DIAGNOSIS — E559 Vitamin D deficiency, unspecified: Secondary | ICD-10-CM

## 2013-05-30 DIAGNOSIS — E785 Hyperlipidemia, unspecified: Secondary | ICD-10-CM

## 2013-05-30 MED ORDER — VITAMIN D (ERGOCALCIFEROL) 1.25 MG (50000 UNIT) PO CAPS: 50000.0000 [IU] | ORAL_CAPSULE | ORAL | Status: DC

## 2013-05-30 NOTE — Telephone Encounter (Signed)
Called patient today as she missed her lipid clinic appointment yesterday.  She had a family emergency.  She tells me that last week her PCP increased her lipitor from 40 mg tiw up to 40 mg daily.  So far she is tolerating this well.  She tells me she occasionally gets leg cramps but was getting these before starting statin.  She has a h/o low vitamin D (23 in 03/2013) which can contribute to muscle cramping and inability to tolerate statin.  She also has a strong family h/o cancer and heart disease and she wants to get the vitamin D level up to hopefully prevent these as well.  Her problem is she can only tolerate over the counter vitamin D 2,000 units daily.  Anymore than this causes GI side effects.  I asked if she had ever tried prescription Vitamin D, and she said no, however she would like to try this if possible since it is only once weekly.  Given her strong family h/o CAD in father, aunt, and her mother's side of the family, and her elevated LDL-P and Lp(a), she needs to stay on a daily statin.  Getting her vitamin D level to normal value will likely help her tolerability of statin, so will start Vitamin D 50,000 Units weekly this week.    Will recheck NMR LipoProfile, Vitamin D 25-OH, and LFTs in 10 weeks, then see me 3 days later.  Patient agreeable to this, and will call me if any side effects or problems occur.    To Dr. Delton SeeNelson as Lorain ChildesFYI only.

## 2013-06-17 ENCOUNTER — Ambulatory Visit: Payer: BC Managed Care – PPO | Admitting: Pharmacist

## 2013-06-17 ENCOUNTER — Ambulatory Visit: Payer: BC Managed Care – PPO | Admitting: Cardiology

## 2013-06-23 ENCOUNTER — Ambulatory Visit: Payer: BC Managed Care – PPO | Admitting: Cardiology

## 2013-06-23 ENCOUNTER — Ambulatory Visit: Payer: BC Managed Care – PPO | Admitting: Pharmacist

## 2013-07-07 ENCOUNTER — Other Ambulatory Visit: Payer: Self-pay | Admitting: Physician Assistant

## 2013-08-05 ENCOUNTER — Other Ambulatory Visit: Payer: Self-pay | Admitting: Physician Assistant

## 2013-08-05 ENCOUNTER — Other Ambulatory Visit: Payer: BC Managed Care – PPO

## 2013-08-08 ENCOUNTER — Ambulatory Visit: Payer: BC Managed Care – PPO | Admitting: Cardiology

## 2013-08-08 ENCOUNTER — Ambulatory Visit: Payer: BC Managed Care – PPO | Admitting: Pharmacist

## 2013-08-11 ENCOUNTER — Other Ambulatory Visit: Payer: Self-pay | Admitting: Emergency Medicine

## 2013-08-22 ENCOUNTER — Encounter: Payer: Self-pay | Admitting: Cardiology

## 2013-09-01 ENCOUNTER — Ambulatory Visit (INDEPENDENT_AMBULATORY_CARE_PROVIDER_SITE_OTHER): Payer: BC Managed Care – PPO | Admitting: Internal Medicine

## 2013-09-01 ENCOUNTER — Encounter: Payer: Self-pay | Admitting: Internal Medicine

## 2013-09-01 VITALS — BP 96/70 | HR 76 | Temp 98.2°F | Resp 16 | Ht 60.75 in | Wt 142.0 lb

## 2013-09-01 DIAGNOSIS — F4323 Adjustment disorder with mixed anxiety and depressed mood: Secondary | ICD-10-CM

## 2013-09-01 DIAGNOSIS — R030 Elevated blood-pressure reading, without diagnosis of hypertension: Secondary | ICD-10-CM

## 2013-09-01 DIAGNOSIS — I10 Essential (primary) hypertension: Secondary | ICD-10-CM

## 2013-09-01 DIAGNOSIS — E559 Vitamin D deficiency, unspecified: Secondary | ICD-10-CM

## 2013-09-01 DIAGNOSIS — Z79899 Other long term (current) drug therapy: Secondary | ICD-10-CM

## 2013-09-01 DIAGNOSIS — E119 Type 2 diabetes mellitus without complications: Secondary | ICD-10-CM

## 2013-09-01 DIAGNOSIS — R7303 Prediabetes: Secondary | ICD-10-CM

## 2013-09-01 MED ORDER — ALPRAZOLAM 0.5 MG PO TABS: ORAL_TABLET | ORAL | Status: DC

## 2013-09-01 MED ORDER — METFORMIN HCL ER 500 MG PO TB24: ORAL_TABLET | ORAL | Status: DC

## 2013-09-01 NOTE — Progress Notes (Signed)
Patient ID: Pamela Howe, female   DOB: 11-Mar-1971, 43 y.o.   MRN: [id number]    This very nice 43 y.o. MWF with T2 NIDDM or PreDiabetes who presents for follow up reporting recent fasting and post prandial glucoses have been elevated in the range 130 - 200+. She has history of PreDiabetes with A1c 5.7% in 2012 and then was lost to F/U. She has had a 23 # weight gain over the last 2-3 years. She was treated with Metformin for Gestational Diabetes and after pregnancy went into a honeymoon period and sugars normalized until her recent weight gain.  Patient denies any symptoms of reactive hypoglycemia, diabetic polys, paresthesias or visual blurring. She also is followed for Hypertension, Hyperlipidemia and Vitamin D Deficiency.    HTN predates since 2012. BP has been controlled at home. Today's BP: 96/70 mmHg . Patient denies any cardiac type chest pain, palpitations, dyspnea/orthopnea/PND, dizziness, claudication, or dependent edema.   Hyperlipidemia is controlled with diet & meds. Last Cholesterol was  315, Triglycerides were 252, HDL 61 and LDL 204 in Mar 2012 and she was recommended treatment with Crestor, but was lost to F/U in this office until recently returning to re-establish care. Patient denies myalgias or other med SE's.    Further, Patient has history of Vitamin D Deficiency of 29 in 2012.  She in not supplementing vitamin D.  Current Outpatient Prescriptions on File Prior to Visit  Medication Sig Dispense Refill  . albuterol (PROVENTIL HFA;VENTOLIN HFA) 108 (90 BASE) MCG/ACT inhaler Inhale 2 puffs into the lungs every 6 (six) hours as needed. Shortness of breath      . ALPRAZolam (XANAX) 0.5 MG tablet TAKE 1/2-1 TABLET AT BEDTIME AS NEEDED  30 tablet  0  . atorvastatin (LIPITOR) 40 MG tablet TAKE 1 TABLET (40 MG TOTAL) BY MOUTH DAILY.  30 tablet  2  . cetirizine (ZYRTEC) 10 MG tablet Take 10 mg by mouth daily.      . CHANTIX 1 MG tablet TAKE 1 TABLET BY MOUTH TWICE A DAY  60 tablet  3  .  hydrochlorothiazide (HYDRODIURIL) 25 MG tablet TAKE 1 TABLET DAILY  30 tablet  2  . losartan (COZAAR) 100 MG tablet Take 1 tablet (100 mg total) by mouth daily.  30 tablet  11  . nitroGLYCERIN (NITROSTAT) 0.3 MG SL tablet Place 1 tablet (0.3 mg total) under the tongue every 5 (five) minutes as needed for chest pain.  25 tablet  1  . pantoprazole (PROTONIX) 40 MG tablet Take 1 tablet (40 mg total) by mouth daily.  30 tablet  5  . sertraline (ZOLOFT) 100 MG tablet Take 200 mg by mouth daily.      Marland Kitchen. triamcinolone (NASACORT) 55 MCG/ACT nasal inhaler Place 2 sprays into the nose daily as needed. allergies      . zolpidem (AMBIEN) 10 MG tablet Take 10 mg by mouth at bedtime as needed. For sleep       No current facility-administered medications on file prior to visit.     Allergies  Allergen Reactions  . Ace Inhibitors     Cough  . Doxycycline Nausea And Vomiting  . Penicillins     PMHx:   Past Medical History  Diagnosis Date  . Hypertension   . Neuropathy   . Arthritis   . Preeclampsia complicating hypertension   . Anxiety   . Postpartum depression   . Hernia   . Prediabetes   . Vitamin D deficiency  FHx:    Reviewed / unchanged  SHx:    Reviewed / unchanged   Systems Review: Constitutional: Denies fever, chills, wt changes, headaches, insomnia, fatigue, night sweats, change in appetite. Eyes: Denies redness, blurred vision, diplopia, discharge, itchy, watery eyes.  ENT: Denies discharge, congestion, post nasal drip, epistaxis, sore throat, earache, hearing loss, dental pain, tinnitus, vertigo, sinus pain, snoring.  CV: Denies chest pain, palpitations, irregular heartbeat, syncope, dyspnea, diaphoresis, orthopnea, PND, claudication, edema. Respiratory: denies cough, dyspnea, DOE, pleurisy, hoarseness, laryngitis, wheezing.  Gastrointestinal: Denies dysphagia, odynophagia, heartburn, reflux, water brash, abdominal pain or cramps, nausea, vomiting, bloating, diarrhea,  constipation, hematemesis, melena, hematochezia,  or hemorrhoids. Genitourinary: Denies dysuria, frequency, urgency, nocturia, hesitancy, discharge, hematuria, flank pain. Musculoskeletal: Denies arthralgias, myalgias, stiffness, jt. swelling, pain, limp, strain/sprain.  Skin: Denies pruritus, rash, hives, warts, acne, eczema, change in skin lesion(s). Neuro: No weakness, tremor, incoordination, spasms, paresthesia, or pain. Psychiatric: Denies confusion, memory loss, or sensory loss. Endo: Denies change in weight, skin, hair change.  Heme/Lymph: No excessive bleeding, bruising, orenlarged lymph nodes.   Exam:  BP 96/70  Pulse 76  Temp(Src) 98.2 F (36.8 C) (Temporal)  Resp 16  Ht 5' 0.75" (1.543 m)  Wt 142 lb (64.411 kg)  BMI 27.05 kg/m2  Appears over nourished with trunkal obesity & in no distress. Eyes: PERRLA, EOMs, conjunctiva no swelling or erythema. Sinuses: No frontal/maxillary tenderness ENT/Mouth: EAC's clear, TM's nl w/o erythema, bulging. Nares clear w/o erythema, swelling, exudates. Oropharynx clear without erythema or exudates. Oral hygiene is good. Tongue normal, non obstructing. Hearing intact.  Neck: Supple. Thyroid nl. Car 2+/2+ without bruits, nodes or JVD. Chest: Respirations nl with BS clear & equal w/o rales, rhonchi, wheezing or stridor.  Cor: Heart sounds normal w/ regular rate and rhythm without sig. murmurs, gallops, clicks, or rubs. Peripheral pulses normal and equal  without edema.  Musculoskeletal: Full ROM all peripheral extremities, joint stability, 5/5 strength, and normal gait.  Skin: Warm, dry without exposed rashes, lesions, ecchymosis apparent.  Neuro: Cranial nerves intact, reflexes equal bilaterally. Sensory-motor testing grossly intact. Tendon reflexes grossly intact.  Pysch: Alert & oriented x 3. Insight and judgement nl & appropriate. No ideations.  Assessment and Plan:  1. Hypertension - Continue monitor blood pressure at home. Continue  diet/meds same.  2. Hyperlipidemia - Continue diet/meds, exercise,& lifestyle modifications. Continue monitor periodic cholesterol/liver & renal functions   3. T2 NIDDM - continue recommend prudent low glycemic diet, weight control, regular exercise, diabetic monitoring and periodic eye exams.  4. Vitamin D Deficiency - Continue supplementation.   Recommended regular exercise, BP monitoring, weight control, and discussed med and SE's. Recommended labs to assess and monitor clinical status. Further disposition pending results of labs.   Long discussion regarding the importance of diet and weight loss and recommended Dr Francis DowseJoel Fuhrman's book "the End of Diabetes".  Given Rx Metformin 5oo mg Xr to start 1 qD and gradually titrate up to 4 tabs daily to control sugars.

## 2013-09-01 NOTE — Patient Instructions (Signed)
Insulin Resistance Blood sugar (glucose) levels are controlled by a hormone called insulin. Insulin is made by your pancreas. When your blood glucose goes up, insulin is released into your blood. Insulin is required for your body to function normally. However, your body can become resistant to your own insulin or to insulin given to treat diabetes. In either case, insulin resistance can lead to serious problems. These problems include:  Type 2 diabetes.  Heart disease.  High blood pressure.  Stroke.  Polycystic ovary syndrome.  Fatty liver. CAUSES  Insulin resistance can develop for many different reasons. It is more likely to happen in people with these conditions or characteristics:  Obesity.  Inactivity.  Pregnancy.  High blood pressure.  Stress.  Steroid use.  Infection or severe illness.  Increased levels of cholesterol and triglycerides. SYMPTOMS  There are no symptoms. You may have symptoms related to the various complications of insulin resistance.  DIAGNOSIS  Several different things can make your caregiver suspect you have insulin resistance. These include:  High blood glucose (hyperglycemia).  Abnormal cholesterol levels.  High uric acid levels.  Changes related to blood pressure.  Changes related to inflammation. Insulin resistance can be determined with blood tests. An elevated insulin level when you have not eaten might suggest resistance. Other more complicated tests are sometimes necessary. TREATMENT  Lifestyle changes are the most important treatment for insulin resistance.   If you are overweight and you have insulin resistance, you can improve your insulin sensitivity by losing weight.  Moderate exercise for 30 40 minutes, 4 days a week, can improve insulin sensitivity. Some medicines can also help improve your insulin sensitivity. Your caregiver can discuss these with you if they are appropriate.  HOME CARE INSTRUCTIONS   Do not  smoke.  Keep your weight at a healthy level.  Get exercise.  If you have diabetes, follow your caregiver's directions.  If you have high blood pressure, follow your caregiver's directions.  Only take prescription medicines for pain, fever, or discomfort as directed by your caregiver. SEEK MEDICAL CARE IF:   You are diabetic and you are having problems keeping your blood glucose levels at target range.  You are having episodes of low blood glucose (hypoglycemia).  You feel you might be having side effects from your medicines.  You have symptoms of an illness that is not improving after 3 4 days.  You have a sore or wound that is not healing.  You notice a change in vision or a new problem with your vision. SEEK IMMEDIATE MEDICAL CARE IF:   Your blood glucose goes below 70, especially if you have confusion, lightheadedness, or other symptoms with it.  Your blood glucose is very high (as advised by your caregiver) twice in a row.  You pass out.  You have chest pain or trouble breathing.  You have a sudden, severe headache.  You have sudden weakness in one arm or one leg.  You have sudden difficulty speaking or swallowing.  You develop vomiting or diarrhea that is getting worse or not improving after 1 day. Document Released: 06/06/2005 Document Revised: 10/17/2011 Document Reviewed: 09/26/2012 [employer] Patient Information 2014 [employer]. Diabetes, Type 2, Am I At Risk? Diabetes is a lasting (chronic) disease. In type 2 diabetes, the pancreas does not make enough insulin, and the body does not respond normally to the insulin that is made. This type of diabetes was also previously called adult onset diabetes. About 90% of all those who have diabetes have   type 2. It usually occurs after the age of 40, but can occur at any age.  People develop type 2 diabetes because they do not use insulin properly. Eventually, the pancreas cannot make enough insulin for the body's needs.  Over time, the amount of glucose (sugar) in the blood increases. RISK FACTORS  Overweight  the more weight you have, the more resistant your cells become to insulin.  Family history  you are more likely to get diabetes if a parent or sibling has diabetes.  Race certain races get diabetes more.  African Americans.  American Indians.  Asian Americans.  Hispanics.  Pacific Islander.  Inactive exercise helps control weight and helps your cells be more sensitive to insulin.  Gestational diabetes  some women develop diabetes while they are pregnant. This goes away when they deliver. However, they are 50-60% more likely to develop type 2 diabetes at a later time.  Having a baby over 9 pounds  a sign that you may have had gestational diabetes.  Age the risk of diabetes goes up as you get older, especially after age 45.  High blood pressure (hypertension). SYMPTOMS Many people have no signs or symptoms. Symptoms can be so mild that you might not even notice them. Some of these signs are:  Increased thirst.  Increased hunger.  Tiredness (fatigue).  Increased urination, especially at night.  Weight loss.  Blurred vision.  Sores that do not heal. WHO SHOULD BE TESTED?  Anyone 45 years or older, especially if overweight, should consider getting tested.  If you are younger than 45, overweight, and have one or more of the risk factors, you should consider getting tested. DIAGNOSIS  Fasting blood glucose (FBS). Usually, 2 are done.  FBS 101-125 mg/dl is considered pre-diabetes.  FBS 126 mg/dl or greater is considered diabetes.  2 hour Oral Glucose Tolerance Test (OGTT). This test is preformed by first having you not eat or drink for several hours. You are then given something sweet to drink and your blood glucose is measured fasting, at one hour and 2 hours. This test tells how well you are able to handle sugars or carbohydrates.  Fasting: 60-100 mg/dl.  1 hour: less than  200 mg/dl.  2 hours: less than 140 mg/dl.  A1c A1c is a blood glucose test that gives and average of your blood glucose over 3 months. It is the accepted method to use to diagnose diabetes.  A1c 5.7-6.4% is considered pre-diabetes.  A1c 6.5% or greater is considered diabetes. WHAT DOES IT MEAN TO HAVE PRE-DIABETES? Pre-diabetes means you are at risk for getting type 2 diabetes. Your blood glucose is higher than normal, but not yet high enough to diagnose diabetes. The good news is, if you have pre-diabetes you can reduce the risk of getting diabetes and even return to normal blood glucose levels. With modest weight loss and moderate physical activity, you can delay or prevent type 2 diabetes.  PREVENTION You cannot do anything about race, age or family history, but you can lower your chances of getting diabetes. You can:   Exercise regularly and be active.  Reduce fat and calorie intake.  Make wise food choices as much as you can.  Reduce your intake of salt and alcohol.  Maintain a reasonable weight.  Keep blood pressure in an acceptable range. Take medication if needed.  Not smoke.  Maintain an acceptable cholesterol level (HDL, LDL, Triglycerides). Take medication if needed. DOING MY PART: GETTING STARTED Making big changes in   your life is hard, especially if you are faced with more than one change. You can make it easier by taking these steps:  Make a plan to change behavior.  Decide exactly what you will do and when you will do it.  Plan what you need to get ready.  Think about what might prevent you from reaching your goals.  Find family and friends who will support and encourage you.  Decide how you will reward yourself when you do what you have planned.  Your doctor, dietitian, or counselor can help you make a plan. HERE ARE SOME OF THE AREAS YOU MAY WISH TO CHANGE TO REDUCE YOUR RISK OF DIABETES. If you are overweight or obese, choose sensible ways to get in  shape. Even small amounts of weight loss, like 5-10 pounds, can help reduce the effects of insulin resistance and help blood glucose control. Diet  Avoid crash diets. Instead, eat less of the foods you usually have. Limit the amount of fat you eat.  Increase your physical activity. Aim for at least 30 minutes of exercise most days of the week.  Set a reasonable weight-loss goal, such as losing 1 pound a week. Aim for a long-term goal of losing 5-7% of your total body weight.  Make wise food choices most of the time.  What you eat has a big impact on your health. By making wise food choices, you can help control your body weight, blood pressure, and cholesterol.  Take a hard look at the serving sizes of the foods you eat. Reduce serving sizes of meat, desserts, and foods high in fat. Increase your intake of fruits and vegetables.  Limit your fat intake to about 25% of your total calories. For example, if your food choices add up to about 2,000 calories a day, try to eat no more than 56 grams of fat. Your caregiver or a dietitian can help you figure out how much fat to have. You can check food labels for fat content too.  You may also want to reduce the number of calories you have each day.  Keep a food log. Write down what you eat, how much you eat, and anything else that helps keep you on track.  When you meet your goal, reward yourself with a nonfood item or activity. Exercise  Be physically active every day.  Keep and exercise log. Write down what exercise you did, for how long, and anything else that keeps you on track.  Regular exercise (like brisk walking) tackles several risk factors at once. It helps you lose weight, it keeps your cholesterol and blood pressure under control, and it helps your body use insulin. People who are physically active for 30 minutes a day, 5 days a week, reduced their risk of type 2 diabetes. If you are not very active, you should start slowly at first.  Talk with your caregiver first about what kinds of exercise would be safe for you. Make a plan to increase your activity level with the goal of being active for at least 30 minutes a day, most days of the week.  Choose activities you enjoy. Here are some ways to work extra activity into your daily routine:  Take the stairs rather than an elevator or escalator.  Park at the far end of the lot and walk.  Get off the bus a few stops early and walk the rest of the way.  Walk or bicycle instead of drive whenever you can. Medications Some   people need medication to help control their blood pressure or cholesterol levels. If you do, take your medicines as directed. Ask your caregiver whether there are any medicines you can take to prevent type 2 diabetes. Document Released: 04/20/2003 Document Revised: 07/10/2011 Document Reviewed: 01/13/2009 [employer] Patient Information 2014 [employer].  

## 2013-09-02 LAB — BASIC METABOLIC PANEL WITH GFR
BUN: 8 mg/dL (ref 6–23)
CO2: 22 meq/L (ref 19–32)
CREATININE: 0.65 mg/dL (ref 0.50–1.10)
Calcium: 9.4 mg/dL (ref 8.4–10.5)
Chloride: 109 mEq/L (ref 96–112)
GFR, Est African American: >89 mL/min
GFR, Est Non African American: >89 mL/min
GLUCOSE: 101 mg/dL — AB (ref 70–99)
Potassium: 5.4 mEq/L — ABNORMAL HIGH (ref 3.5–5.3)
SODIUM: 138 meq/L (ref 135–145)

## 2013-09-02 LAB — TSH: TSH: 2.104 u[IU]/mL (ref 0.350–4.500)

## 2013-09-02 LAB — CBC WITH DIFFERENTIAL/PLATELET
BASOS PCT: 0 % (ref 0–1)
Basophils Absolute: 0 10*3/uL (ref 0.0–0.1)
Eosinophils Absolute: 0.1 10*3/uL (ref 0.0–0.7)
Eosinophils Relative: 1 % (ref 0–5)
HCT: 44.3 % (ref 36.0–46.0)
Hemoglobin: 15.5 g/dL — ABNORMAL HIGH (ref 12.0–15.0)
LYMPHS ABS: 2.6 10*3/uL (ref 0.7–4.0)
Lymphocytes Relative: 31 % (ref 12–46)
MCH: 31.4 pg (ref 26.0–34.0)
MCHC: 35 g/dL (ref 30.0–36.0)
MCV: 89.9 fL (ref 78.0–100.0)
Monocytes Absolute: 0.6 10*3/uL (ref 0.1–1.0)
Monocytes Relative: 7 % (ref 3–12)
NEUTROS PCT: 61 % (ref 43–77)
Neutro Abs: 5.1 10*3/uL (ref 1.7–7.7)
PLATELETS: 295 10*3/uL (ref 150–400)
RBC: 4.93 MIL/uL (ref 3.87–5.11)
RDW: 13.4 % (ref 11.5–15.5)
WBC: 8.4 10*3/uL (ref 4.0–10.5)

## 2013-09-02 LAB — HEPATIC FUNCTION PANEL
ALBUMIN: 4.5 g/dL (ref 3.5–5.2)
ALT: 20 U/L (ref 0–35)
AST: 17 U/L (ref 0–37)
Alkaline Phosphatase: 78 U/L (ref 39–117)
BILIRUBIN DIRECT: 0.1 mg/dL (ref 0.0–0.3)
BILIRUBIN TOTAL: 0.4 mg/dL (ref 0.2–1.2)
Indirect Bilirubin: 0.3 mg/dL (ref 0.2–1.2)
Total Protein: 7.3 g/dL (ref 6.0–8.3)

## 2013-09-02 LAB — HEMOGLOBIN A1C
Hgb A1c MFr Bld: 5.5 % (ref <5.7)
Mean Plasma Glucose: 111 mg/dL (ref <117)

## 2013-09-02 LAB — MAGNESIUM: Magnesium: 2.1 mg/dL (ref 1.5–2.5)

## 2013-09-02 LAB — INSULIN, FASTING: Insulin fasting, serum: 17 u[IU]/mL (ref 3–28)

## 2013-10-09 ENCOUNTER — Ambulatory Visit (INDEPENDENT_AMBULATORY_CARE_PROVIDER_SITE_OTHER): Payer: BC Managed Care – PPO | Admitting: Physician Assistant

## 2013-10-09 ENCOUNTER — Encounter: Payer: Self-pay | Admitting: Physician Assistant

## 2013-10-09 VITALS — BP 120/70 | HR 80 | Temp 98.1°F | Resp 16 | Wt 140.0 lb

## 2013-10-09 DIAGNOSIS — F4323 Adjustment disorder with mixed anxiety and depressed mood: Secondary | ICD-10-CM

## 2013-10-09 DIAGNOSIS — R7309 Other abnormal glucose: Secondary | ICD-10-CM

## 2013-10-09 DIAGNOSIS — R21 Rash and other nonspecific skin eruption: Secondary | ICD-10-CM

## 2013-10-09 DIAGNOSIS — M542 Cervicalgia: Secondary | ICD-10-CM

## 2013-10-09 DIAGNOSIS — R7303 Prediabetes: Secondary | ICD-10-CM

## 2013-10-09 MED ORDER — PHENTERMINE HCL 37.5 MG PO TABS: 37.5000 mg | ORAL_TABLET | Freq: Every day | ORAL | Status: DC

## 2013-10-09 MED ORDER — NYSTATIN 100000 UNIT/GM EX CREA: 1.0000 "application " | TOPICAL_CREAM | Freq: Two times a day (BID) | CUTANEOUS | Status: DC

## 2013-10-09 MED ORDER — NYSTATIN 100000 UNIT/GM EX POWD
CUTANEOUS | Status: DC
Start: 2013-10-09 — End: 2014-05-05

## 2013-10-09 MED ORDER — PREDNISONE 20 MG PO TABS: ORAL_TABLET | ORAL | Status: DC

## 2013-10-09 NOTE — Patient Instructions (Addendum)
Breakfast: try 80 calorie light and fit greek, whole wheat english muffin with eggs, can do eggs- try to add protein Lunch and snacks: more veggies  We are starting you on Metformin to prevent or treat diabetes. Metformin does not cause low blood sugars. In order to create energy your cells need insulin and sugar but sometime your cells do not accept the insulin and this can cause increased sugars and decreased energy. The Metformin helps your cells accept insulin and the sugar to give you more energy.   The two most common side effects are nausea and diarrhea, follow these rules to avoid it! You can take imodium per box instructions when starting metformin if needed.   Rules of metformin: 1) start out slow with only one pill daily. Our goal for you is 4 pills a day or 2000mg  total.  2) take with your largest meal. 3) Take with least amount of carbs.   Call if you have any problems.     Bad carbs also include fruit juice, alcohol, and sweet tea. These are empty calories that do not signal to your brain that you are full.   Please remember the good carbs are still carbs which convert into sugar. So please measure them out no more than 1/2-1 cup of rice, oatmeal, pasta, and beans.  Veggies are however free foods! Pile them on.   I like lean protein at every meal such as chicken, [place], pork chops, cottage cheese, etc. Just do not fry these meats and please center your meal around vegetable, the meats should be a side dish.   No all fruit is created equal. Please see the list below, the fruit at the bottom is higher in sugars than the fruit at the top    Phentermine  While taking the medication we will ask that you come into the office once a month to monitor your weight, blood pressure, and heart rate. In addition we can help answer your questions about diet, exercise, and help you every step of the way with your weight loss journey. Sometime it is helpful if you bring in a food diary or  use an app on your phone such as myfitnesspal to record your calorie intake, especially in the beginning.   You can start out on 1/3 to 1/2 a pill in the morning and if you are tolerating it well you can increase to one pill daily.   What is this medicine? PHENTERMINE (FEN ter meen) decreases your appetite. This medicine is intended to be used in addition to a healthy reduced calorie diet and exercise. The best results are achieved this way. This medicine is only indicated for short-term use. Eventually your weight loss may level out and the medication will no longer be needed.   How should I use this medicine? Take this medicine by mouth. Follow the directions on the prescription label. The tablets should stay in the bottle until immediately before you take your dose. Take your doses at regular intervals. Do not take your medicine more often than directed.  Overdosage: If you think you have taken too much of this medicine contact a poison control center or emergency room at once. NOTE: This medicine is only for you. Do not share this medicine with others.  What if I miss a dose? If you miss a dose, take it as soon as you can. If it is almost time for your next dose, take only that dose. Do not take double or extra doses.  Do not increase or in any way change your dose without consulting your doctor.  What should I watch for while using this medicine? Notify your physician immediately if you become short of breath while doing your normal activities. Do not take this medicine within 6 hours of bedtime. It can keep you from getting to sleep. Avoid drinks that contain caffeine and try to stick to a regular bedtime every night. Do not stand or sit up quickly, especially if you are an older patient. This reduces the risk of dizzy or fainting spells. Avoid alcoholic drinks.  What side effects may I notice from receiving this medicine? Side effects that you should report to your doctor or health care  professional as soon as possible: -chest pain, palpitations -depression or severe changes in mood -increased blood pressure -irritability -nervousness or restlessness -severe dizziness -shortness of breath -problems urinating -unusual swelling of the legs -vomiting  Side effects that usually do not require medical attention (report to your doctor or health care professional if they continue or are bothersome): -blurred vision or other eye problems -changes in sexual ability or desire -constipation or diarrhea -difficulty sleeping -dry mouth or unpleasant taste -headache -nausea This list may not describe all possible side effects. Call your doctor for medical advice about side effects. You may report side effects to FDA at 1-800-FDA-1088.

## 2013-10-09 NOTE — Progress Notes (Signed)
Diabetes Education and Follow-Up Visit  43 y.o.female presents for diabetic education. She complains of none. The patient is checking her sugars at home. She states she has a pinched nerve in her neck that she took prednisone for that helps. She has a rash under her breast.   Home BG Monitoring:  Checking 2 times a day. Average:  100's  High: 220 after meals, 120 in AM  Low:  80's  Breakfast: Cereal  ROS: no polyuria or polydipsia, no chest pain, dyspnea or TIA's, no numbness, tingling or pain in extremities  Physical Exam: Blood pressure 120/70, pulse 80, temperature 98.1 F (36.7 C), resp. rate 16, weight 140 lb (63.504 kg). Body mass index is 26.67 kg/(m^2). General Appearance:  alert, oriented, no acute distress heart sounds regular rate and rhythm, S1, S2 normal, no murmur, click, rub or gallop, chest clear, no hepatosplenomegaly, feet: normal DP and PT pulses, no trophic changes or ulcerative lesions and normal sensory exam Skin: class erythematous bumps with satellite lesions under bilateral breasts Neck: Decreased ROM due to pain, DTRs normal, strength normal.   Labs: Lab Results  Component Value Date   HGBA1C 5.5 09/01/2013    No results found for this basenameConcepcion Howe    Lab Results  Component Value Date   CHOL 232* 04/18/2013   HDL 35* 04/18/2013   LDLCALC 104* 05/08/2013   TRIG 133 05/08/2013   CHOLHDL 6.6 04/18/2013     Assessment: 1.  Diabetes type II  2. BP is at goal. 3. Cholesterol is at goal.  4. Weight loss 5. Yeast 6. Neck pain  Plan: Discussed general issues about diabetes pathophysiology and management. Addressed ADA diet. Suggested low cholesterol diet. Encouraged aerobic exercise. Reminded to get yearly retinal exam. Reminded to bring in blood sugar diary at next visit. Follow up in 3 months or as needed. Add phentermine Nystatin cream and powder Prednisone called in for neck  Recommendations: 1.  Patient is counseled on  appropriate foot care. 2.  BP goal < 130/80. 3.  LDL goal of < 100, HDL > 40 and TG < 150. 4.  Eye Exam yearly and Dental Exam every 6 months. 5.  Dietary recommendations 6.  Physical Activity recommendations  OVER 30 minutes of exam, counseling, chart review, referral performed

## 2013-10-26 ENCOUNTER — Other Ambulatory Visit: Payer: Self-pay | Admitting: Emergency Medicine

## 2013-10-30 ENCOUNTER — Other Ambulatory Visit: Payer: Self-pay | Admitting: Internal Medicine

## 2013-12-01 ENCOUNTER — Other Ambulatory Visit: Payer: Self-pay | Admitting: Physician Assistant

## 2013-12-04 ENCOUNTER — Other Ambulatory Visit: Payer: Self-pay | Admitting: Internal Medicine

## 2013-12-04 ENCOUNTER — Other Ambulatory Visit: Payer: Self-pay | Admitting: Physician Assistant

## 2013-12-04 MED ORDER — PHENTERMINE HCL 37.5 MG PO TABS: 37.5000 mg | ORAL_TABLET | Freq: Every day | ORAL | Status: DC

## 2013-12-10 ENCOUNTER — Ambulatory Visit: Payer: Self-pay | Admitting: Physician Assistant

## 2013-12-29 ENCOUNTER — Other Ambulatory Visit: Payer: Self-pay | Admitting: Physician Assistant

## 2013-12-29 ENCOUNTER — Other Ambulatory Visit: Payer: Self-pay | Admitting: Internal Medicine

## 2014-01-07 ENCOUNTER — Telehealth: Payer: Self-pay | Admitting: *Deleted

## 2014-01-07 NOTE — Telephone Encounter (Signed)
Pt is calling needing refills on   Generic xanax  G lipitor   Says she called them thurs & pharm dosent have them? Also pt is feeling tired & just run down BS average is 170"s just fyi & pt asking would it help to break the phentermine up into 2 doses?

## 2014-01-08 MED ORDER — ALPRAZOLAM 0.5 MG PO TABS: ORAL_TABLET | ORAL | Status: DC

## 2014-01-08 MED ORDER — ATORVASTATIN CALCIUM 40 MG PO TABS: ORAL_TABLET | ORAL | Status: DC

## 2014-01-08 NOTE — Telephone Encounter (Signed)
RX for phentermine called to pharmacy, lmom for patient to take 1/2 am and 1/2 lunch if this is what she is referring to as far as "2 doses" advised her per Quentin Mulling, PA t watch the sugars and follow up or call sooner if needed

## 2014-01-20 ENCOUNTER — Ambulatory Visit (INDEPENDENT_AMBULATORY_CARE_PROVIDER_SITE_OTHER): Payer: BC Managed Care – PPO | Admitting: Physician Assistant

## 2014-01-20 ENCOUNTER — Encounter: Payer: Self-pay | Admitting: Physician Assistant

## 2014-01-20 VITALS — BP 130/80 | HR 88 | Temp 98.1°F | Resp 16 | Ht 60.0 in | Wt 123.0 lb

## 2014-01-20 DIAGNOSIS — E559 Vitamin D deficiency, unspecified: Secondary | ICD-10-CM

## 2014-01-20 DIAGNOSIS — E119 Type 2 diabetes mellitus without complications: Secondary | ICD-10-CM

## 2014-01-20 DIAGNOSIS — I10 Essential (primary) hypertension: Secondary | ICD-10-CM

## 2014-01-20 DIAGNOSIS — E782 Mixed hyperlipidemia: Secondary | ICD-10-CM

## 2014-01-20 DIAGNOSIS — Z79899 Other long term (current) drug therapy: Secondary | ICD-10-CM

## 2014-01-20 LAB — CBC WITH DIFFERENTIAL/PLATELET
BASOS ABS: 0 10*3/uL (ref 0.0–0.1)
BASOS PCT: 0 % (ref 0–1)
Eosinophils Absolute: 0.1 10*3/uL (ref 0.0–0.7)
Eosinophils Relative: 1 % (ref 0–5)
HEMATOCRIT: 43.5 % (ref 36.0–46.0)
Hemoglobin: 15.1 g/dL — ABNORMAL HIGH (ref 12.0–15.0)
Lymphocytes Relative: 29 % (ref 12–46)
Lymphs Abs: 2.8 10*3/uL (ref 0.7–4.0)
MCH: 30.1 pg (ref 26.0–34.0)
MCHC: 34.7 g/dL (ref 30.0–36.0)
MCV: 86.8 fL (ref 78.0–100.0)
MONO ABS: 0.6 10*3/uL (ref 0.1–1.0)
MONOS PCT: 6 % (ref 3–12)
Neutro Abs: 6.2 10*3/uL (ref 1.7–7.7)
Neutrophils Relative %: 64 % (ref 43–77)
Platelets: 339 10*3/uL (ref 150–400)
RBC: 5.01 MIL/uL (ref 3.87–5.11)
RDW: 13.2 % (ref 11.5–15.5)
WBC: 9.7 10*3/uL (ref 4.0–10.5)

## 2014-01-20 MED ORDER — ATORVASTATIN CALCIUM 40 MG PO TABS: ORAL_TABLET | ORAL | Status: DC

## 2014-01-20 MED ORDER — ALPRAZOLAM 0.5 MG PO TABS: ORAL_TABLET | ORAL | Status: DC

## 2014-01-20 MED ORDER — ALBUTEROL SULFATE HFA 108 (90 BASE) MCG/ACT IN AERS: 2.0000 | INHALATION_SPRAY | Freq: Four times a day (QID) | RESPIRATORY_TRACT | Status: DC | PRN

## 2014-01-20 MED ORDER — PHENTERMINE HCL 37.5 MG PO TABS: 37.5000 mg | ORAL_TABLET | Freq: Every day | ORAL | Status: DC

## 2014-01-20 MED ORDER — HYDROCHLOROTHIAZIDE 25 MG PO TABS: ORAL_TABLET | ORAL | Status: DC

## 2014-01-20 MED ORDER — ACYCLOVIR 400 MG PO TABS: 400.0000 mg | ORAL_TABLET | Freq: Three times a day (TID) | ORAL | Status: DC

## 2014-01-20 NOTE — Patient Instructions (Signed)

## 2014-01-20 NOTE — Progress Notes (Signed)
Assessment and Plan:  Hypertension: Continue medication, monitor blood pressure at home. Continue DASH diet. Cholesterol: Continue diet and exercise. Check cholesterol.  Diabetes-Continue diet and exercise. Check A1C Vitamin D Def- check level and continue medications.  Obesity with co morbidities- long discussion about weight loss, diet, and exercise  Will start the patient on phentermine ? ADD- will do another 3 months of phentermine and may do well with a trail of adderall.    Continue diet and meds as discussed. Further disposition pending results of labs. Discussed med's effects and SE's.    HPI 43 y.o. female  presents for 3 month follow up with hypertension, hyperlipidemia, diabetes and vitamin D. Her blood pressure has been controlled at home, she is only on the HCTZ and her BP has been good, today their BP is BP: 130/80 mmHg She does workout, swimming daily. She denies chest pain, shortness of breath, dizziness.  She is on cholesterol medication and denies myalgias. Her cholesterol is at goal. The cholesterol last visit was:   Lab Results  Component Value Date   CHOL 232* 04/18/2013   HDL 35* 04/18/2013   LDLCALC 104* 05/08/2013   TRIG 133 05/08/2013   CHOLHDL 6.6 04/18/2013   She has been working on diet and exercise for Diabetes, and denies polydipsia, polyuria and visual disturbances. Last A1C in the office was:  Lab Results  Component Value Date   HGBA1C 5.5 09/01/2013   Patient is not on Vitamin D supplement. Lab Results  Component Value Date   VD25OH 23* 04/18/2013     BMI is Body mass index is 24.02 kg/(m^2)., She is working on diet and exercise and has done well, she has been on phentermine and has lost 17lbs, She also states that she has had more focused with the phentermine. She has never been formally diagnosed with ADD but she has several of her kids with it.  Wt Readings from Last 3 Encounters:  01/20/14 123 lb (55.792 kg)  10/09/13 140 lb (63.504 kg)  09/01/13  142 lb (64.411 kg)   She is still on the chantix and has pretty much quit smoking for the last 2 month.    Current Medications:  Current Outpatient Prescriptions on File Prior to Visit  Medication Sig Dispense Refill  . albuterol (PROVENTIL HFA;VENTOLIN HFA) 108 (90 BASE) MCG/ACT inhaler Inhale 2 puffs into the lungs every 6 (six) hours as needed. Shortness of breath      . ALPRAZolam (XANAX) 0.5 MG tablet TAKE 1/2 TO 1 TABLETS 2 OR 3 TIMES DAILY AS NEEDED FOR ANXIETY  90 tablet  0  . atorvastatin (LIPITOR) 40 MG tablet TAKE 1 TABLET (40 MG TOTAL) BY MOUTH DAILY.  30 tablet  0  . cetirizine (ZYRTEC) 10 MG tablet Take 10 mg by mouth daily.      . CHANTIX 1 MG tablet TAKE 1 TABLET BY MOUTH TWICE A DAY  60 tablet  3  . hydrochlorothiazide (HYDRODIURIL) 25 MG tablet TAKE 1 TABLET DAILY  30 tablet  2  . losartan (COZAAR) 100 MG tablet Take 1 tablet (100 mg total) by mouth daily.  30 tablet  11  . metFORMIN (GLUCOPHAGE-XR) 500 MG 24 hr tablet TAKE 2 TABLETS 2 TIMES A DAY WITH FOOD FOR BLOOD SUGAR  120 tablet  3  . nitroGLYCERIN (NITROSTAT) 0.3 MG SL tablet Place 1 tablet (0.3 mg total) under the tongue every 5 (five) minutes as needed for chest pain.  25 tablet  1  . nystatin (  MYCOSTATIN/NYSTOP) 100000 UNIT/GM POWD Apply to affected area daily as needed  30 g  2  . nystatin cream (MYCOSTATIN) Apply 1 application topically 2 (two) times daily.  30 g  0  . pantoprazole (PROTONIX) 40 MG tablet Take 1 tablet (40 mg total) by mouth daily.  30 tablet  5  . phentermine (ADIPEX-P) 37.5 MG tablet Take 1 tablet (37.5 mg total) by mouth daily before breakfast.  30 tablet  1  . predniSONE (DELTASONE) 20 MG tablet Take one tablet three times daily with food for 3 days, take one tablet two times daily with food for 3 days, take one tablet daily for 5 days.  20 tablet  0  . sertraline (ZOLOFT) 100 MG tablet Take 200 mg by mouth daily.      Marland Kitchen triamcinolone (NASACORT) 55 MCG/ACT nasal inhaler Place 2 sprays into the  nose daily as needed. allergies      . zolpidem (AMBIEN) 10 MG tablet Take 10 mg by mouth at bedtime as needed. For sleep       No current facility-administered medications on file prior to visit.   Medical History:  Past Medical History  Diagnosis Date  . Hypertension   . Neuropathy   . Arthritis   . Preeclampsia complicating hypertension   . Anxiety   . Postpartum depression   . Hernia   . Prediabetes   . Vitamin D deficiency    Allergies:  Allergies  Allergen Reactions  . Ace Inhibitors     Cough  . Doxycycline Nausea And Vomiting  . Penicillins      Review of Systems:  = complains of   = denies  General: Fatigue  Fever  Chills  Weakness   Insomnia  Eyes: Redness  Blurred vision  Diplopia   ENT: Congestion  Sinus Pain  Post Nasal Drip  Sore Throat  Earache   Cardiac: Chest pain/pressure  SOB  Orthopnea   Palpitations   Paroxysmal nocturnal dyspnea[ ]  Claudication  Edema   Pulmonary: Cough  Wheezing[ ]   SOB   Snoring   GI: Nausea  Vomiting[ ]  Dysphagia[ ]  Heartburn[ ]  Abdominal pain  Constipation ; Diarrhea ; BRBPR  Melena[ ]  GU: Hematuria[ ]  Dysuria  Nocturia[ ]  Urgency   Hesitancy  Discharge  Neuro: Headaches[ ]  Vertigo[ ]  Paresthesias[ ]  Spasm  Speech changes  Incoordination   Ortho: Arthritis  Joint pain  Muscle pain  Joint swelling  Back Pain  Skin:  Rash   Pruritis  Change in skin lesion   Psych: Depression[ ]  Anxiety[ ]  Confusion  Memory loss   Heme/Lypmh: Bleeding  Bruising  Enlarged lymph nodes   Endocrine: Visual blurring  Paresthesia  Polyuria  Polydypsea    Heat/cold intolerance  Hypoglycemia   Family history- Review and unchanged Social history- Review and unchanged Physical Exam: BP 130/80  Pulse 88  Temp(Src) 98.1 F (36.7 C)  Resp 16  Ht 5' (1.524 m)  Wt 123 lb (55.792 kg)  BMI 24.02  kg/m2 Wt Readings from Last 3 Encounters:  01/20/14 123 lb (55.792 kg)  10/09/13 140 lb (63.504 kg)  09/01/13 142 lb (64.411 kg)   General Appearance: Well nourished, in  no apparent distress. Eyes: PERRLA, EOMs, conjunctiva no swelling or erythema Sinuses: No Frontal/maxillary tenderness ENT/Mouth: Ext aud canals clear, TMs without erythema, bulging. No erythema, swelling, or exudate on post pharynx.  Tonsils not swollen or erythematous. Hearing normal.  Neck: Supple, thyroid normal.  Respiratory: Respiratory effort normal, BS equal bilaterally without rales, rhonchi, wheezing or stridor.  Cardio: RRR with no MRGs. Brisk peripheral pulses without edema.  Abdomen: Soft, + BS.  Non tender, no guarding, rebound, hernias, masses. Lymphatics: Non tender without lymphadenopathy.  Musculoskeletal: Full ROM, 5/5 strength, normal gait.  Skin: Warm, dry without rashes, lesions, ecchymosis.  Neuro: Cranial nerves intact. No cerebellar symptoms. Sensation intact.  Psych: Awake and oriented X 3, normal affect, Insight and Judgment appropriate.    Quentin Mulling 2:53 PM

## 2014-01-21 LAB — BASIC METABOLIC PANEL WITH GFR
BUN: 9 mg/dL (ref 6–23)
CO2: 28 mEq/L (ref 19–32)
CREATININE: 0.73 mg/dL (ref 0.50–1.10)
Calcium: 9.7 mg/dL (ref 8.4–10.5)
Chloride: 97 mEq/L (ref 96–112)
GLUCOSE: 91 mg/dL (ref 70–99)
Potassium: 3.8 mEq/L (ref 3.5–5.3)
Sodium: 135 mEq/L (ref 135–145)

## 2014-01-21 LAB — HEMOGLOBIN A1C
Hgb A1c MFr Bld: 5.6 % (ref <5.7)
Mean Plasma Glucose: 114 mg/dL (ref <117)

## 2014-01-21 LAB — LIPID PANEL
Cholesterol: 123 mg/dL (ref 0–200)
HDL: 43 mg/dL (ref >39)
LDL CALC: 58 mg/dL (ref 0–99)
Total CHOL/HDL Ratio: 2.9 Ratio
Triglycerides: 108 mg/dL (ref <150)
VLDL: 22 mg/dL (ref 0–40)

## 2014-01-21 LAB — HEPATIC FUNCTION PANEL
ALBUMIN: 4.9 g/dL (ref 3.5–5.2)
ALT: 14 U/L (ref 0–35)
AST: 16 U/L (ref 0–37)
Alkaline Phosphatase: 57 U/L (ref 39–117)
Bilirubin, Direct: 0.1 mg/dL (ref 0.0–0.3)
Indirect Bilirubin: 0.3 mg/dL (ref 0.2–1.2)
Total Bilirubin: 0.4 mg/dL (ref 0.2–1.2)
Total Protein: 7.5 g/dL (ref 6.0–8.3)

## 2014-01-21 LAB — TSH: TSH: 2.492 u[IU]/mL (ref 0.350–4.500)

## 2014-01-21 LAB — MAGNESIUM: MAGNESIUM: 1.4 mg/dL — AB (ref 1.5–2.5)

## 2014-02-27 ENCOUNTER — Other Ambulatory Visit: Payer: Self-pay | Admitting: Physician Assistant

## 2014-03-02 ENCOUNTER — Encounter: Payer: Self-pay | Admitting: Physician Assistant

## 2014-03-03 ENCOUNTER — Other Ambulatory Visit: Payer: Self-pay | Admitting: Physician Assistant

## 2014-03-05 ENCOUNTER — Other Ambulatory Visit: Payer: Self-pay | Admitting: Physician Assistant

## 2014-04-10 ENCOUNTER — Other Ambulatory Visit: Payer: Self-pay | Admitting: Physician Assistant

## 2014-05-05 ENCOUNTER — Ambulatory Visit (INDEPENDENT_AMBULATORY_CARE_PROVIDER_SITE_OTHER): Payer: BLUE CROSS/BLUE SHIELD | Admitting: Physician Assistant

## 2014-05-05 ENCOUNTER — Encounter: Payer: Self-pay | Admitting: Physician Assistant

## 2014-05-05 VITALS — BP 128/78 | HR 76 | Temp 98.1°F | Resp 16 | Ht 60.0 in | Wt 126.0 lb

## 2014-05-05 DIAGNOSIS — E559 Vitamin D deficiency, unspecified: Secondary | ICD-10-CM

## 2014-05-05 DIAGNOSIS — R7309 Other abnormal glucose: Secondary | ICD-10-CM

## 2014-05-05 DIAGNOSIS — E782 Mixed hyperlipidemia: Secondary | ICD-10-CM

## 2014-05-05 DIAGNOSIS — R7303 Prediabetes: Secondary | ICD-10-CM

## 2014-05-05 DIAGNOSIS — I10 Essential (primary) hypertension: Secondary | ICD-10-CM

## 2014-05-05 DIAGNOSIS — Z79899 Other long term (current) drug therapy: Secondary | ICD-10-CM

## 2014-05-05 LAB — CBC WITH DIFFERENTIAL/PLATELET
BASOS ABS: 0 10*3/uL (ref 0.0–0.1)
Basophils Relative: 0 % (ref 0–1)
EOS ABS: 0.1 10*3/uL (ref 0.0–0.7)
EOS PCT: 1 % (ref 0–5)
HEMATOCRIT: 41.9 % (ref 36.0–46.0)
Hemoglobin: 14.6 g/dL (ref 12.0–15.0)
LYMPHS ABS: 2.8 10*3/uL (ref 0.7–4.0)
Lymphocytes Relative: 25 % (ref 12–46)
MCH: 30 pg (ref 26.0–34.0)
MCHC: 34.8 g/dL (ref 30.0–36.0)
MCV: 86 fL (ref 78.0–100.0)
MPV: 9.3 fL (ref 8.6–12.4)
Monocytes Absolute: 0.7 10*3/uL (ref 0.1–1.0)
Monocytes Relative: 6 % (ref 3–12)
Neutro Abs: 7.5 10*3/uL (ref 1.7–7.7)
Neutrophils Relative %: 68 % (ref 43–77)
PLATELETS: 331 10*3/uL (ref 150–400)
RBC: 4.87 MIL/uL (ref 3.87–5.11)
RDW: 13.7 % (ref 11.5–15.5)
WBC: 11.1 10*3/uL — ABNORMAL HIGH (ref 4.0–10.5)

## 2014-05-05 LAB — BASIC METABOLIC PANEL WITH GFR
BUN: 14 mg/dL (ref 6–23)
CO2: 24 meq/L (ref 19–32)
Calcium: 9.2 mg/dL (ref 8.4–10.5)
Chloride: 105 mEq/L (ref 96–112)
Creat: 0.62 mg/dL (ref 0.50–1.10)
GFR, Est African American: >89 mL/min
Glucose, Bld: 83 mg/dL (ref 70–99)
POTASSIUM: 4.8 meq/L (ref 3.5–5.3)
Sodium: 139 mEq/L (ref 135–145)

## 2014-05-05 LAB — LIPID PANEL
Cholesterol: 190 mg/dL (ref 0–200)
HDL: 50 mg/dL (ref >39)
LDL CALC: 122 mg/dL — AB (ref 0–99)
Total CHOL/HDL Ratio: 3.8 Ratio
Triglycerides: 90 mg/dL (ref <150)
VLDL: 18 mg/dL (ref 0–40)

## 2014-05-05 LAB — HEPATIC FUNCTION PANEL
ALK PHOS: 58 U/L (ref 39–117)
ALT: 11 U/L (ref 0–35)
AST: 15 U/L (ref 0–37)
Albumin: 4.6 g/dL (ref 3.5–5.2)
Bilirubin, Direct: <0.1 mg/dL (ref 0.0–0.3)
Total Bilirubin: 0.2 mg/dL (ref 0.2–1.2)
Total Protein: 7.1 g/dL (ref 6.0–8.3)

## 2014-05-05 LAB — TSH: TSH: 3.431 u[IU]/mL (ref 0.350–4.500)

## 2014-05-05 LAB — MAGNESIUM: MAGNESIUM: 1.7 mg/dL (ref 1.5–2.5)

## 2014-05-05 MED ORDER — METFORMIN HCL ER 500 MG PO TB24: ORAL_TABLET | ORAL | Status: DC

## 2014-05-05 MED ORDER — AMPHETAMINE-DEXTROAMPHETAMINE 20 MG PO TABS: 20.0000 mg | ORAL_TABLET | Freq: Every day | ORAL | Status: DC

## 2014-05-05 MED ORDER — ALPRAZOLAM 0.5 MG PO TABS: ORAL_TABLET | ORAL | Status: DC

## 2014-05-05 MED ORDER — HYDROCHLOROTHIAZIDE 25 MG PO TABS: ORAL_TABLET | ORAL | Status: DC

## 2014-05-05 NOTE — Progress Notes (Signed)
Assessment and Plan:  Hypertension: Continue medication, monitor blood pressure at home. Continue DASH diet.  Reminder to go to the ER if any CP, SOB, nausea, dizziness, severe HA, changes vision/speech, left arm numbness and tingling, and jaw pain. Cholesterol: Continue diet and exercise. Check cholesterol.  Pre-diabetes-Continue diet and exercise. Check A1C Vitamin D Def- check level and continue medications.  Anxiety- will continue xanax TID for short term and zoloft for now, Anxiety- continue medications, stress management techniques discussed, increase water, good sleep hygiene discussed, increase exercise, and increase veggies.  ? ADD- will try very low dose adderall  once daily.    Continue diet and meds as discussed. Further disposition pending results of labs.  HPI 44 y.o. female  presents for 3 month follow up with hypertension, hyperlipidemia, prediabetes and vitamin D. Her blood pressure has been controlled at home, today their BP is BP: 128/78 mmHg She does not workout. She denies chest pain, shortness of breath, dizziness.  She is not on cholesterol medication and denies myalgias. Her cholesterol is at goal. The cholesterol last visit was:   Lab Results  Component Value Date   CHOL 123 01/20/2014   HDL 43 01/20/2014   LDLCALC 58 01/20/2014   TRIG 108 01/20/2014   CHOLHDL 2.9 01/20/2014   She has been working on diet and exercise for prediabetes, and denies paresthesia of the feet, polydipsia, polyuria and visual disturbances. Last A1C in the office was:  Lab Results  Component Value Date   HGBA1C 5.6 01/20/2014   Patient is on Vitamin D supplement.   Lab Results  Component Value Date   VD25OH 23* 04/18/2013     BMI is Body mass index is 24.61 kg/(m^2)., she is working on diet and exercise, she has been on phentermine for 6-9 months, states it helps with focus. She has gained 3 lbs but has been under increased stress, her house has been in foreclosure, had to move.  . Wt Readings from Last 3 Encounters:  05/05/14 126 lb (57.153 kg)  01/20/14 123 lb (55.792 kg)  10/09/13 140 lb (63.504 kg)  She has been smoking still due to stess, she states she will wait until stress gets better.  She has been taking her anxiety medicaitons 3 times a day, due to panic attacks.   Current Medications:  Current Outpatient Prescriptions on File Prior to Visit  Medication Sig Dispense Refill  . acyclovir (ZOVIRAX) 400 MG tablet Take 1 tablet (400 mg total) by mouth 3 (three) times daily. 30 tablet 0  . albuterol (PROVENTIL HFA;VENTOLIN HFA) 108 (90 BASE) MCG/ACT inhaler Inhale 2 puffs into the lungs every 6 (six) hours as needed. Shortness of breath 18 g 1  . ALPRAZolam (XANAX) 0.5 MG tablet TAKE 1/2 TO 1 TABLETS 2 OR 3 TIMES DAILY AS NEEDED FOR ANXIETY 90 tablet 2  . atorvastatin (LIPITOR) 40 MG tablet TAKE 1 TABLET (40 MG TOTAL) BY MOUTH DAILY. 30 tablet 2  . cetirizine (ZYRTEC) 10 MG tablet Take 10 mg by mouth daily.    . CHANTIX 1 MG tablet TAKE 1 TABLET BY MOUTH TWICE A DAY 56 tablet 3  . hydrochlorothiazide (HYDRODIURIL) 25 MG tablet TAKE 1 TABLET DAILY 90 tablet 2  . metFORMIN (GLUCOPHAGE-XR) 500 MG 24 hr tablet TAKE 2 TABLETS 2 TIMES A DAY WITH FOOD FOR BLOOD SUGAR 120 tablet 3  . nystatin cream (MYCOSTATIN) Apply 1 application topically 2 (two) times daily. 30 g 0  . pantoprazole (PROTONIX) 40 MG tablet TAKE 1  TABLET (40 MG TOTAL) BY MOUTH DAILY. 30 tablet 5  . phentermine (ADIPEX-P) 37.5 MG tablet Take 1 tablet (37.5 mg total) by mouth daily before breakfast. 30 tablet 2  . sertraline (ZOLOFT) 100 MG tablet Take 200 mg by mouth daily.    Marland Kitchen. triamcinolone (NASACORT) 55 MCG/ACT nasal inhaler Place 2 sprays into the nose daily as needed. allergies    . zolpidem (AMBIEN) 10 MG tablet Take 10 mg by mouth at bedtime as needed. For sleep    . nitroGLYCERIN (NITROSTAT) 0.3 MG SL tablet Place 1 tablet (0.3 mg total) under the tongue every 5 (five) minutes as needed for chest  pain. 25 tablet 1   No current facility-administered medications on file prior to visit.   Medical History:  Past Medical History  Diagnosis Date  . Hypertension   . Neuropathy   . Arthritis   . Preeclampsia complicating hypertension   . Anxiety   . Postpartum depression   . Hernia   . Prediabetes   . Vitamin D deficiency    Allergies:  Allergies  Allergen Reactions  . Ace Inhibitors     Cough  . Doxycycline Nausea And Vomiting  . Penicillins     Review of Systems:  Review of Systems  Constitutional: Negative.   HENT: Negative.   Eyes: Negative.   Respiratory: Negative.   Cardiovascular: Negative.   Gastrointestinal: Negative.   Genitourinary: Negative.   Musculoskeletal: Negative.   Skin: Negative.   Neurological: Negative.   Psychiatric/Behavioral: Positive for depression. Negative for suicidal ideas, hallucinations, memory loss and substance abuse. The patient is nervous/anxious. The patient does not have insomnia.      Family history- Review and unchanged Social history- Review and unchanged Physical Exam: BP 128/78 mmHg  Pulse 76  Temp(Src) 98.1 F (36.7 C)  Resp 16  Ht 5' (1.524 m)  Wt 126 lb (57.153 kg)  BMI 24.61 kg/m2 Wt Readings from Last 3 Encounters:  05/05/14 126 lb (57.153 kg)  01/20/14 123 lb (55.792 kg)  10/09/13 140 lb (63.504 kg)   General Appearance: Well nourished, in no apparent distress. Eyes: PERRLA, EOMs, conjunctiva no swelling or erythema Sinuses: No Frontal/maxillary tenderness ENT/Mouth: Ext aud canals clear, TMs without erythema, bulging. No erythema, swelling, or exudate on post pharynx.  Tonsils not swollen or erythematous. Hearing normal.  Neck: Supple, thyroid normal.  Respiratory: Respiratory effort normal, BS equal bilaterally without rales, rhonchi, wheezing or stridor.  Cardio: RRR with no MRGs. Brisk peripheral pulses without edema.  Abdomen: Soft, + BS.  Non tender, no guarding, rebound, hernias,  masses. Lymphatics: Non tender without lymphadenopathy.  Musculoskeletal: Full ROM, 5/5 strength, normal gait.  Skin: Warm, dry without rashes, lesions, ecchymosis.  Neuro: Cranial nerves intact. Normal muscle tone, no cerebellar symptoms. Sensation intact.  Psych: Awake and oriented X 3, normal affect, Insight and Judgment appropriate.    Quentin Mullingollier, Micheala Morissette, PA-C 2:14 PM [facility]

## 2014-05-06 LAB — HEMOGLOBIN A1C
Hgb A1c MFr Bld: 5.7 % — ABNORMAL HIGH (ref <5.7)
MEAN PLASMA GLUCOSE: 117 mg/dL — AB (ref <117)

## 2014-05-06 LAB — VITAMIN D 25 HYDROXY (VIT D DEFICIENCY, FRACTURES): Vit D, 25-Hydroxy: 26 ng/mL — ABNORMAL LOW (ref 30–100)

## 2014-05-06 LAB — INSULIN, FASTING: Insulin fasting, serum: 13.8 u[IU]/mL (ref 2.0–19.6)

## 2014-05-07 ENCOUNTER — Encounter: Payer: Self-pay | Admitting: Physician Assistant

## 2014-06-09 ENCOUNTER — Other Ambulatory Visit: Payer: Self-pay

## 2014-06-10 ENCOUNTER — Other Ambulatory Visit: Payer: Self-pay | Admitting: Physician Assistant

## 2014-06-10 MED ORDER — AMPHETAMINE-DEXTROAMPHETAMINE 20 MG PO TABS: 20.0000 mg | ORAL_TABLET | Freq: Every day | ORAL | Status: DC

## 2014-06-11 ENCOUNTER — Other Ambulatory Visit: Payer: Self-pay

## 2014-06-11 DIAGNOSIS — E039 Hypothyroidism, unspecified: Secondary | ICD-10-CM

## 2014-07-31 ENCOUNTER — Other Ambulatory Visit: Payer: Self-pay | Admitting: Physician Assistant

## 2014-08-04 ENCOUNTER — Other Ambulatory Visit: Payer: Self-pay | Admitting: Physician Assistant

## 2014-08-06 ENCOUNTER — Other Ambulatory Visit: Payer: Self-pay | Admitting: Physician Assistant

## 2014-08-10 ENCOUNTER — Other Ambulatory Visit: Payer: Self-pay | Admitting: Physician Assistant

## 2014-08-11 ENCOUNTER — Encounter: Payer: Self-pay | Admitting: Internal Medicine

## 2014-08-11 ENCOUNTER — Ambulatory Visit (INDEPENDENT_AMBULATORY_CARE_PROVIDER_SITE_OTHER): Payer: BLUE CROSS/BLUE SHIELD | Admitting: Internal Medicine

## 2014-08-11 VITALS — BP 122/72 | HR 88 | Temp 97.7°F | Resp 16 | Ht 60.0 in | Wt 120.0 lb

## 2014-08-11 DIAGNOSIS — I10 Essential (primary) hypertension: Secondary | ICD-10-CM

## 2014-08-11 DIAGNOSIS — R7309 Other abnormal glucose: Secondary | ICD-10-CM

## 2014-08-11 DIAGNOSIS — E782 Mixed hyperlipidemia: Secondary | ICD-10-CM

## 2014-08-11 DIAGNOSIS — E559 Vitamin D deficiency, unspecified: Secondary | ICD-10-CM

## 2014-08-11 DIAGNOSIS — R7303 Prediabetes: Secondary | ICD-10-CM

## 2014-08-11 DIAGNOSIS — Z79899 Other long term (current) drug therapy: Secondary | ICD-10-CM

## 2014-08-11 LAB — CBC WITH DIFFERENTIAL/PLATELET
Basophils Absolute: 0.1 K/uL (ref 0.0–0.1)
Basophils Relative: 1 % (ref 0–1)
Eosinophils Absolute: 0.1 K/uL (ref 0.0–0.7)
Eosinophils Relative: 1 % (ref 0–5)
HCT: 43.3 % (ref 36.0–46.0)
Hemoglobin: 15.2 g/dL — ABNORMAL HIGH (ref 12.0–15.0)
Lymphocytes Relative: 34 % (ref 12–46)
Lymphs Abs: 2.9 K/uL (ref 0.7–4.0)
MCH: 29.6 pg (ref 26.0–34.0)
MCHC: 35.1 g/dL (ref 30.0–36.0)
MCV: 84.2 fL (ref 78.0–100.0)
MPV: 8.8 fL (ref 8.6–12.4)
Monocytes Absolute: 0.6 K/uL (ref 0.1–1.0)
Monocytes Relative: 7 % (ref 3–12)
Neutro Abs: 4.8 K/uL (ref 1.7–7.7)
Neutrophils Relative %: 57 % (ref 43–77)
Platelets: 367 K/uL (ref 150–400)
RBC: 5.14 MIL/uL — ABNORMAL HIGH (ref 3.87–5.11)
RDW: 13.6 % (ref 11.5–15.5)
WBC: 8.4 K/uL (ref 4.0–10.5)

## 2014-08-11 LAB — LIPID PANEL
Cholesterol: 128 mg/dL (ref 0–200)
HDL: 47 mg/dL (ref >=46)
LDL CALC: 68 mg/dL (ref 0–99)
Total CHOL/HDL Ratio: 2.7 Ratio
Triglycerides: 66 mg/dL (ref <150)
VLDL: 13 mg/dL (ref 0–40)

## 2014-08-11 LAB — HEPATIC FUNCTION PANEL
ALT: 14 U/L (ref 0–35)
AST: 13 U/L (ref 0–37)
Albumin: 4.3 g/dL (ref 3.5–5.2)
Alkaline Phosphatase: 49 U/L (ref 39–117)
BILIRUBIN TOTAL: 0.3 mg/dL (ref 0.2–1.2)
Bilirubin, Direct: <0.1 mg/dL (ref 0.0–0.3)
Total Protein: 6.9 g/dL (ref 6.0–8.3)

## 2014-08-11 LAB — BASIC METABOLIC PANEL WITH GFR
BUN: 9 mg/dL (ref 6–23)
CO2: 24 meq/L (ref 19–32)
CREATININE: 0.6 mg/dL (ref 0.50–1.10)
Calcium: 8.9 mg/dL (ref 8.4–10.5)
Chloride: 102 mEq/L (ref 96–112)
GFR, Est African American: >89 mL/min
GFR, Est Non African American: >89 mL/min
Glucose, Bld: 97 mg/dL (ref 70–99)
Potassium: 4.4 mEq/L (ref 3.5–5.3)
SODIUM: 137 meq/L (ref 135–145)

## 2014-08-11 LAB — HEMOGLOBIN A1C
HEMOGLOBIN A1C: 5.7 % — AB (ref <5.7)
Mean Plasma Glucose: 117 mg/dL — ABNORMAL HIGH (ref <117)

## 2014-08-11 LAB — TSH: TSH: 2.534 u[IU]/mL (ref 0.350–4.500)

## 2014-08-11 LAB — MAGNESIUM: Magnesium: 1.7 mg/dL (ref 1.5–2.5)

## 2014-08-11 MED ORDER — VARENICLINE TARTRATE 1 MG PO TABS: 1.0000 mg | ORAL_TABLET | Freq: Two times a day (BID) | ORAL | Status: DC

## 2014-08-11 MED ORDER — METFORMIN HCL ER 500 MG PO TB24: ORAL_TABLET | ORAL | Status: DC

## 2014-08-11 MED ORDER — AMPHETAMINE-DEXTROAMPHETAMINE 20 MG PO TABS: 20.0000 mg | ORAL_TABLET | Freq: Every day | ORAL | Status: DC

## 2014-08-11 MED ORDER — NYSTATIN 100000 UNIT/GM EX CREA: 1.0000 "application " | TOPICAL_CREAM | Freq: Two times a day (BID) | CUTANEOUS | Status: DC

## 2014-08-11 MED ORDER — MELOXICAM 15 MG PO TABS
7.5000 mg | ORAL_TABLET | Freq: Every day | ORAL | Status: DC
Start: 2014-08-11 — End: 2015-01-12

## 2014-08-11 MED ORDER — ALPRAZOLAM 0.5 MG PO TABS: ORAL_TABLET | ORAL | Status: DC

## 2014-08-11 MED ORDER — ATORVASTATIN CALCIUM 40 MG PO TABS: ORAL_TABLET | ORAL | Status: DC

## 2014-08-11 MED ORDER — HYDROCHLOROTHIAZIDE 25 MG PO TABS: ORAL_TABLET | ORAL | Status: DC

## 2014-08-11 NOTE — Patient Instructions (Signed)
GETTING OFF OF PPI's    Nexium/protonix/prilosec/Omeprazole/Dexilant/Aciphex are called PPI's, they are great at healing your stomach but should only be taken for a short period of time.     Recent studies have shown that taken for a long time they  can increase the risk of osteoporosis (weakening of your bones), pneumonia, low magnesium, restless legs, Cdiff (infection that causes diarrhea), DEMENTIA and most recently kidney damage / disease / insufficiency.     Due to this information we want to try to stop the PPI but if you try to stop it abruptly this can cause rebound acid and worsening symptoms.   So this is how we want you to get off the PPI:  - Start taking the nexium/protonix/prilosec/PPI  every other day with  zantac (ranitidine) 2 x a day for 2-4 weeks  - then decrease the PPI to every 3 days while taking the zantac (ranitidine) twice a day the other  days for 2-4  Weeks  - then you can try the zantac (ranitidine) once at night or up to 2 x day as needed.  - you can continue on this once at night or stop all together  - Avoid alcohol, spicy foods, NSAIDS (aleve, ibuprofen) at this time. See foods below.   +++++++++++++++++++++++++++++++++++++++++++  Food Choices for Gastroesophageal Reflux Disease  When you have gastroesophageal reflux disease (GERD), the foods you eat and your eating habits are very important. Choosing the right foods can help ease the discomfort of GERD. WHAT GENERAL GUIDELINES DO I NEED TO FOLLOW?  Choose fruits, vegetables, whole grains, low-fat dairy products, and low-fat meat, fish, and poultry.  Limit fats such as oils, salad dressings, butter, nuts, and avocado.  Keep a food diary to identify foods that cause symptoms.  Avoid foods that cause reflux. These may be different for different people.  Eat frequent small meals instead of three large meals each day.  Eat your meals slowly, in a relaxed setting.  Limit fried foods.  Cook foods  using methods other than frying.  Avoid drinking alcohol.  Avoid drinking large amounts of liquids with your meals.  Avoid bending over or lying down until 2-3 hours after eating.   WHAT FOODS ARE NOT RECOMMENDED? The following are some foods and drinks that may worsen your symptoms:  Vegetables Tomatoes. Tomato juice. Tomato and spaghetti sauce. Chili peppers. Onion and garlic. Horseradish. Fruits Oranges, grapefruit, and lemon (fruit and juice). Meats High-fat meats, fish, and poultry. This includes hot dogs, ribs, ham, sausage, salami, and bacon. Dairy Whole milk and chocolate milk. Sour cream. Cream. Butter. Ice cream. Cream cheese.  Beverages Coffee and tea, with or without caffeine. Carbonated beverages or energy drinks. Condiments Hot sauce. Barbecue sauce.  Sweets/Desserts Chocolate and cocoa. Donuts. Peppermint and spearmint. Fats and Oils High-fat foods, including French fries and potato chips. Other Vinegar. Strong spices, such as black pepper, white pepper, red pepper, cayenne, curry powder, cloves, ginger, and chili powder. Nexium/protonix/prilosec are called PPI's, they are great at healing your stomach but should only be taken for a short period of time.    

## 2014-08-11 NOTE — Progress Notes (Signed)
Patient ID: Pamela Howe, female   DOB: Jul 11, 1970, 44 y.o.   MRN: [id number] Assessment and Plan:  Hypertension:  -Continue medication -monitor blood pressure at home. -Continue DASH diet -Reminder to go to the ER if any CP, SOB, nausea, dizziness, severe HA, changes vision/speech, left arm numbness and tingling and jaw pain.  Cholesterol - Continue diet and exercise -Check cholesterol.   Diabetes without complications -Continue diet and exercise.  -Check A1C  Vitamin D Def -check level -continue medications.   Tobacco abuse -chantix -stop smoking  Fall -mobic for tailbone pain  Continue diet and meds as discussed. Further disposition pending results of labs. Discussed med's effects and SE's.    HPI 44 y.o. female  presents for 3 month follow up with hypertension, hyperlipidemia, diabetes and vitamin D deficiency.   Her blood pressure has been controlled at home, today their BP is BP: 122/72 mmHg.She does workout.  She does yoga and swimming in the summer.   She denies chest pain, shortness of breath, dizziness.   She is on cholesterol medication and denies myalgias. Her cholesterol is at goal. The cholesterol was:  05/05/2014: Cholesterol, Total 190; HDL-C 50; LDL (calc) 122*; Triglycerides 90   She has been working on diet and exercise for diabetes without complications, she is not on bASA, she is not on ACE/ARB, and denies  foot ulcerations, hyperglycemia, increased appetite, nausea, paresthesia of the feet, polydipsia, polyuria, visual disturbances, vomiting and weight loss. Last A1C was: 05/05/2014: Hemoglobin-A1c 5.7*   Patient is on Vitamin D supplement.  She is not taking it.  She reports that it makes her sick and that she cannot take it.   05/05/2014: VITD 26*  She is still taking chantix at home.  She reports that she started smoking again after her mother died.  She reports that she is still smoking.  She is smoking less than 2 packs of cigarretes per week.    Patient  reports a fall yesterday which caused her tailbone to hurt.  NO loss of bowel or bladder.  Takes tylenol or ibuprofen for it.    Current Medications:  Current Outpatient Prescriptions on File Prior to Visit  Medication Sig Dispense Refill  . acyclovir (ZOVIRAX) 400 MG tablet Take 1 tablet (400 mg total) by mouth 3 (three) times daily. 30 tablet 0  . albuterol (PROVENTIL HFA;VENTOLIN HFA) 108 (90 BASE) MCG/ACT inhaler Inhale 2 puffs into the lungs every 6 (six) hours as needed. Shortness of breath 18 g 1  . ALPRAZolam (XANAX) 0.5 MG tablet TAKE 1/2 TO 1 TABLET BY MOUTH 2 TO 3 TIMES A DAY AS NEEDED FOR ANXIETY 90 tablet 2  . amphetamine-dextroamphetamine (ADDERALL) 20 MG tablet Take 1 tablet (20 mg total) by mouth daily. 30 tablet 0  . atorvastatin (LIPITOR) 40 MG tablet TAKE 1 TABLET (40 MG TOTAL) BY MOUTH DAILY. 30 tablet 2  . cetirizine (ZYRTEC) 10 MG tablet Take 10 mg by mouth daily.    . CHANTIX 1 MG tablet TAKE 1 TABLET BY MOUTH TWICE A DAY 56 tablet 3  . hydrochlorothiazide (HYDRODIURIL) 25 MG tablet TAKE 1 TABLET DAILY 90 tablet 2  . metFORMIN (GLUCOPHAGE-XR) 500 MG 24 hr tablet TAKE 2 TABLETS 2 TIMES A DAY WITH FOOD FOR BLOOD SUGAR 120 tablet 3  . nitroGLYCERIN (NITROSTAT) 0.3 MG SL tablet Place 1 tablet (0.3 mg total) under the tongue every 5 (five) minutes as needed for chest pain. 25 tablet 1  . nystatin cream (MYCOSTATIN) Apply 1  application topically 2 (two) times daily. 30 g 0  . pantoprazole (PROTONIX) 40 MG tablet TAKE 1 TABLET (40 MG TOTAL) BY MOUTH DAILY. 30 tablet 5  . sertraline (ZOLOFT) 100 MG tablet Take 200 mg by mouth daily.    Marland Kitchen triamcinolone (NASACORT) 55 MCG/ACT nasal inhaler Place 2 sprays into the nose daily as needed. allergies    . zolpidem (AMBIEN) 10 MG tablet Take 10 mg by mouth at bedtime as needed. For sleep     No current facility-administered medications on file prior to visit.   Medical History:  Past Medical History  Diagnosis Date  . Hypertension   .  Neuropathy   . Arthritis   . Preeclampsia complicating hypertension   . Anxiety   . Postpartum depression   . Hernia   . Prediabetes   . Vitamin D deficiency    Allergies:  Allergies  Allergen Reactions  . Ace Inhibitors     Cough  . Doxycycline Nausea And Vomiting  . Penicillins      Review of Systems:  Review of Systems  Constitutional: Negative for fever, chills, weight loss and diaphoresis.  HENT: Negative for congestion, ear discharge, nosebleeds, sore throat and tinnitus.   Eyes: Negative.   Respiratory: Negative for cough, sputum production, shortness of breath and wheezing.   Cardiovascular: Negative for chest pain, palpitations and leg swelling.  Gastrointestinal: Positive for constipation. Negative for heartburn, nausea, vomiting, abdominal pain, diarrhea, blood in stool and melena.  Genitourinary: Negative for dysuria, urgency and frequency.  Musculoskeletal: Negative.   Skin: Negative.   Neurological: Negative.  Negative for headaches.    Family history- Review and unchanged  Social history- Review and unchanged  Physical Exam: BP 122/72 mmHg  Pulse 88  Temp(Src) 97.7 F (36.5 C)  Resp 16  Ht 5' (1.524 m)  Wt 120 lb (54.432 kg)  BMI 23.44 kg/m2 Wt Readings from Last 3 Encounters:  08/11/14 120 lb (54.432 kg)  05/05/14 126 lb (57.153 kg)  01/20/14 123 lb (55.792 kg)   General Appearance: Well nourished well developed, non-toxic appearing, in no apparent distress. Eyes: PERRLA, EOMs, conjunctiva no swelling or erythema ENT/Mouth: Ear canals clear with no erythema, swelling, or discharge.  TMs normal bilaterally, oropharynx clear, moist, with no exudate.   Neck: Supple, thyroid normal, no JVD, no cervical adenopathy.  Respiratory: Respiratory effort normal, breath sounds clear A&P, no wheeze, rhonchi or rales noted.  No retractions, no accessory muscle usage Cardio: RRR with no MRGs. No noted edema.  Abdomen: Soft, + BS.  Non tender, no guarding,  rebound, hernias, masses. Musculoskeletal: Full ROM, 5/5 strength, Normal gait, Tenderness to palpation of the sacral and cocyx region.  No bruising, color change. Skin: Warm, dry without rashes, lesions, ecchymosis.  Neuro: Awake and oriented X 3, Cranial nerves intact. No cerebellar symptoms.  Psych: normal affect, Insight and Judgment appropriate.    FORCUCCI, Charon Akamine, PA-C 3:12 PM [facility]

## 2014-08-12 LAB — VITAMIN D 25 HYDROXY (VIT D DEFICIENCY, FRACTURES): Vit D, 25-Hydroxy: 21 ng/mL — ABNORMAL LOW (ref 30–100)

## 2014-08-12 LAB — INSULIN, FASTING: Insulin fasting, serum: 8.3 u[IU]/mL (ref 2.0–19.6)

## 2014-09-01 ENCOUNTER — Other Ambulatory Visit: Payer: Self-pay | Admitting: Internal Medicine

## 2014-09-03 ENCOUNTER — Other Ambulatory Visit: Payer: Self-pay | Admitting: Physician Assistant

## 2014-10-26 ENCOUNTER — Other Ambulatory Visit: Payer: Self-pay

## 2014-11-06 ENCOUNTER — Other Ambulatory Visit: Payer: Self-pay | Admitting: Physician Assistant

## 2014-11-06 ENCOUNTER — Other Ambulatory Visit: Payer: Self-pay | Admitting: Internal Medicine

## 2014-11-10 ENCOUNTER — Other Ambulatory Visit: Payer: Self-pay | Admitting: Internal Medicine

## 2014-11-11 ENCOUNTER — Ambulatory Visit: Payer: BLUE CROSS/BLUE SHIELD | Admitting: Internal Medicine

## 2014-11-11 NOTE — Progress Notes (Signed)
NO SHOW

## 2014-12-04 ENCOUNTER — Other Ambulatory Visit: Payer: Self-pay | Admitting: Internal Medicine

## 2015-01-03 ENCOUNTER — Other Ambulatory Visit: Payer: Self-pay | Admitting: Internal Medicine

## 2015-01-12 ENCOUNTER — Ambulatory Visit (INDEPENDENT_AMBULATORY_CARE_PROVIDER_SITE_OTHER): Payer: BLUE CROSS/BLUE SHIELD | Admitting: Physician Assistant

## 2015-01-12 VITALS — BP 110/80 | HR 78 | Temp 97.7°F | Resp 18 | Wt 127.6 lb

## 2015-01-12 DIAGNOSIS — D649 Anemia, unspecified: Secondary | ICD-10-CM

## 2015-01-12 DIAGNOSIS — R079 Chest pain, unspecified: Secondary | ICD-10-CM

## 2015-01-12 DIAGNOSIS — Z720 Tobacco use: Secondary | ICD-10-CM

## 2015-01-12 DIAGNOSIS — I10 Essential (primary) hypertension: Secondary | ICD-10-CM | POA: Diagnosis not present

## 2015-01-12 DIAGNOSIS — Z79899 Other long term (current) drug therapy: Secondary | ICD-10-CM | POA: Diagnosis not present

## 2015-01-12 DIAGNOSIS — R7309 Other abnormal glucose: Secondary | ICD-10-CM | POA: Diagnosis not present

## 2015-01-12 DIAGNOSIS — E782 Mixed hyperlipidemia: Secondary | ICD-10-CM | POA: Diagnosis not present

## 2015-01-12 DIAGNOSIS — N912 Amenorrhea, unspecified: Secondary | ICD-10-CM

## 2015-01-12 DIAGNOSIS — E559 Vitamin D deficiency, unspecified: Secondary | ICD-10-CM | POA: Diagnosis not present

## 2015-01-12 DIAGNOSIS — R7303 Prediabetes: Secondary | ICD-10-CM

## 2015-01-12 DIAGNOSIS — F17200 Nicotine dependence, unspecified, uncomplicated: Secondary | ICD-10-CM

## 2015-01-12 DIAGNOSIS — F419 Anxiety disorder, unspecified: Secondary | ICD-10-CM

## 2015-01-12 LAB — CBC WITH DIFFERENTIAL/PLATELET
BASOS ABS: 0 10*3/uL (ref 0.0–0.1)
Basophils Relative: 0 % (ref 0–1)
Eosinophils Absolute: 0.1 10*3/uL (ref 0.0–0.7)
Eosinophils Relative: 1 % (ref 0–5)
HEMATOCRIT: 40.8 % (ref 36.0–46.0)
HEMOGLOBIN: 13.8 g/dL (ref 12.0–15.0)
LYMPHS PCT: 27 % (ref 12–46)
Lymphs Abs: 2.8 10*3/uL (ref 0.7–4.0)
MCH: 28.9 pg (ref 26.0–34.0)
MCHC: 33.8 g/dL (ref 30.0–36.0)
MCV: 85.4 fL (ref 78.0–100.0)
MONO ABS: 0.6 10*3/uL (ref 0.1–1.0)
MPV: 9.6 fL (ref 8.6–12.4)
Monocytes Relative: 6 % (ref 3–12)
NEUTROS ABS: 6.8 10*3/uL (ref 1.7–7.7)
Neutrophils Relative %: 66 % (ref 43–77)
Platelets: 387 10*3/uL (ref 150–400)
RBC: 4.78 MIL/uL (ref 3.87–5.11)
RDW: 14.9 % (ref 11.5–15.5)
WBC: 10.3 10*3/uL (ref 4.0–10.5)

## 2015-01-12 MED ORDER — PANTOPRAZOLE SODIUM 40 MG PO TBEC: 40.0000 mg | DELAYED_RELEASE_TABLET | Freq: Every day | ORAL | Status: DC

## 2015-01-12 MED ORDER — VARENICLINE TARTRATE 1 MG PO TABS: 1.0000 mg | ORAL_TABLET | Freq: Two times a day (BID) | ORAL | Status: DC

## 2015-01-12 MED ORDER — ACYCLOVIR 400 MG PO TABS: 400.0000 mg | ORAL_TABLET | Freq: Three times a day (TID) | ORAL | Status: DC

## 2015-01-12 MED ORDER — NYSTATIN 100000 UNIT/GM EX CREA: 1.0000 "application " | TOPICAL_CREAM | Freq: Two times a day (BID) | CUTANEOUS | Status: DC

## 2015-01-12 MED ORDER — METFORMIN HCL ER 500 MG PO TB24: ORAL_TABLET | ORAL | Status: DC

## 2015-01-12 MED ORDER — ALPRAZOLAM 0.5 MG PO TABS: ORAL_TABLET | ORAL | Status: DC

## 2015-01-12 MED ORDER — NITROGLYCERIN 0.3 MG SL SUBL: 0.3000 mg | SUBLINGUAL_TABLET | SUBLINGUAL | Status: AC | PRN

## 2015-01-12 NOTE — Patient Instructions (Addendum)
What is the TMJ? The temporomandibular (tem-PUH-ro-man-DIB-yoo-ler) joint, or the TMJ, connects the upper and lower jawbones. This joint allows the jaw to open wide and move back and forth when you chew, talk, or yawn.There are also several muscles that help this joint move. There can be muscle tightness and pain in the muscle that can cause several symptoms.  What causes TMJ pain? There are many causes of TMJ pain. Repeated chewing (for example, chewing gum) and clenching your teeth can cause pain in the joint. Some TMJ pain has no obvious cause. What can I do to ease the pain? There are many things you can do to help your pain get better. When you have pain:  Eat soft foods and stay away from chewy foods (for example, taffy) Try to use both sides of your mouth to chew Don't chew gum Massage Don't open your mouth wide (for example, during yawning or singing) Don't bite your cheeks or fingernails Lower your amount of stress and worry Applying a warm, damp washcloth to the joint may help. Over-the-counter pain medicines such as ibuprofen (one brand: Advil) or acetaminophen (one brand: Tylenol) might also help. Do not use these medicines if you are allergic to them or if your doctor told you not to use them. How can I stop the pain from coming back? When your pain is better, you can do these exercises to make your muscles stronger and to keep the pain from coming back:  Resisted mouth opening: Place your thumb or two fingers under your chin and open your mouth slowly, pushing up lightly on your chin with your thumb. Hold for three to six seconds. Close your mouth slowly. Resisted mouth closing: Place your thumbs under your chin and your two index fingers on the ridge between your mouth and the bottom of your chin. Push down lightly on your chin as you close your mouth. Tongue up: Slowly open and close your mouth while keeping the tongue touching the roof of the mouth. Side-to-side jaw movement:  Place an object about one fourth of an inch thick (for example, two tongue depressors) between your front teeth. Slowly move your jaw from side to side. Increase the thickness of the object as the exercise becomes easier Forward jaw movement: Place an object about one fourth of an inch thick between your front teeth and move the bottom jaw forward so that the bottom teeth are in front of the top teeth. Increase the thickness of the object as the exercise becomes easier. These exercises should not be painful. If it hurts to do these exercises, stop doing them and talk to your family doctor.  Smoking Cessation, Tips for Success If you are ready to quit smoking, congratulations! You have chosen to help yourself be healthier. Cigarettes bring nicotine, tar, carbon monoxide, and other irritants into your body. Your lungs, heart, and blood vessels will be able to work better without these poisons. There are many different ways to quit smoking. Nicotine gum, nicotine patches, a nicotine inhaler, or nicotine nasal spray can help with physical craving. Hypnosis, support groups, and medicines help break the habit of smoking. WHAT THINGS CAN I DO TO MAKE QUITTING EASIER?  Here are some tips to help you quit for good:  Pick a date when you will quit smoking completely. Tell all of your friends and family about your plan to quit on that date.  Do not try to slowly cut down on the number of cigarettes you are smoking. Pick a  quit date and quit smoking completely starting on that day.  Throw away all cigarettes.   Clean and remove all ashtrays from your home, work, and car.  On a card, write down your reasons for quitting. Carry the card with you and read it when you get the urge to smoke.  Cleanse your body of nicotine. Drink enough water and fluids to keep your urine clear or pale yellow. Do this after quitting to flush the nicotine from your body.  Learn to predict your moods. Do not let a bad situation be  your excuse to have a cigarette. Some situations in your life might tempt you into wanting a cigarette.  Never have "just one" cigarette. It leads to wanting another and another. Remind yourself of your decision to quit.  Change habits associated with smoking. If you smoked while driving or when feeling stressed, try other activities to replace smoking. Stand up when drinking your coffee. Brush your teeth after eating. Sit in a different chair when you read the paper. Avoid alcohol while trying to quit, and try to drink fewer caffeinated beverages. Alcohol and caffeine may urge you to smoke.  Avoid foods and drinks that can trigger a desire to smoke, such as sugary or spicy foods and alcohol.  Ask people who smoke not to smoke around you.  Have something planned to do right after eating or having a cup of coffee. For example, plan to take a walk or exercise.  Try a relaxation exercise to calm you down and decrease your stress. Remember, you may be tense and nervous for the first 2 weeks after you quit, but this will pass.  Find new activities to keep your hands busy. Play with a pen, coin, or rubber band. Doodle or draw things on paper.  Brush your teeth right after eating. This will help cut down on the craving for the taste of tobacco after meals. You can also try mouthwash.   Use oral substitutes in place of cigarettes. Try using lemon drops, carrots, cinnamon sticks, or chewing gum. Keep them handy so they are available when you have the urge to smoke.  When you have the urge to smoke, try deep breathing.  Designate your home as a nonsmoking area.  If you are a heavy smoker, ask your health care provider about a prescription for nicotine chewing gum. It can ease your withdrawal from nicotine.  Reward yourself. Set aside the cigarette money you save and buy yourself something nice.  Look for support from others. Join a support group or smoking cessation program. Ask someone at home or  at work to help you with your plan to quit smoking.  Always ask yourself, "Do I need this cigarette or is this just a reflex?" Tell yourself, "Today, I choose not to smoke," or "I do not want to smoke." You are reminding yourself of your decision to quit.  Do not replace cigarette smoking with electronic cigarettes (commonly called e-cigarettes). The safety of e-cigarettes is unknown, and some may contain harmful chemicals.  If you relapse, do not give up! Plan ahead and think about what you will do the next time you get the urge to smoke. HOW WILL I FEEL WHEN I QUIT SMOKING? You may have symptoms of withdrawal because your body is used to nicotine (the addictive substance in cigarettes). You may crave cigarettes, be irritable, feel very hungry, cough often, get headaches, or have difficulty concentrating. The withdrawal symptoms are only temporary. They are strongest when  you first quit but will go away within 10-14 days. When withdrawal symptoms occur, stay in control. Think about your reasons for quitting. Remind yourself that these are signs that your body is healing and getting used to being without cigarettes. Remember that withdrawal symptoms are easier to treat than the major diseases that smoking can cause.  Even after the withdrawal is over, expect periodic urges to smoke. However, these cravings are generally short lived and will go away whether you smoke or not. Do not smoke! WHAT RESOURCES ARE AVAILABLE TO HELP ME QUIT SMOKING? Your health care provider can direct you to community resources or hospitals for support, which may include:  Group support.  Education.  Hypnosis.  Therapy. Document Released: 01/14/2004 Document Revised: 09/01/2013 Document Reviewed: 10/03/2012 [facility] Patient Information 2015 [place], [place]. This information is not intended to replace advice given to you by your health care provider. Make sure you discuss any questions you have with your health care  provider.

## 2015-01-12 NOTE — Progress Notes (Signed)
Assessment and Plan:  Hypertension: Continue medication, monitor blood pressure at home. Continue DASH diet.  Reminder to go to the ER if any CP, SOB, nausea, dizziness, severe HA, changes vision/speech, left arm numbness and tingling, and jaw pain. Cholesterol: Continue diet and exercise. Check cholesterol.  Pre-diabetes-Continue diet and exercise. Check A1C Vitamin D Def- check level and continue medications.  Anxiety- will continue xanax TID for short term and zoloft for now, stress management techniques discussed, increase water, good sleep hygiene discussed, increase exercise, and increase veggies.    Continue diet and meds as discussed. Further disposition pending results of labs.  HPI 44 y.o. female  presents for 3 month follow up with hypertension, hyperlipidemia, prediabetes and vitamin D. Her blood pressure has been controlled at home, today their BP is BP: 110/80 mmHg She does not workout. She denies chest pain, shortness of breath, dizziness.  She is not on cholesterol medication and denies myalgias. Her cholesterol is at goal. The cholesterol last visit was:   Lab Results  Component Value Date   CHOL 128 08/11/2014   HDL 47 08/11/2014   LDLCALC 68 08/11/2014   TRIG 66 08/11/2014   CHOLHDL 2.7 08/11/2014   She has been working on diet and exercise for prediabetes, and denies paresthesia of the feet, polydipsia, polyuria and visual disturbances. Last A1C in the office was:  Lab Results  Component Value Date   HGBA1C 5.7* 08/11/2014   Patient is on Vitamin D supplement, can not take due to being sick.   Lab Results  Component Value Date   VD25OH 21* 08/11/2014     BMI is Body mass index is 24.92 kg/(m^2)., she is working on diet and exercise,she was on phentermine for weight loss, doing well but has been on the needed amount of time. She has gained 3 lbs but has been under increased stress, her house has been in foreclosure, had to move. Father had retinal detachment, son;s  finance about to have a baby.  . Wt Readings from Last 3 Encounters:  01/12/15 127 lb 9.6 oz (57.879 kg)  08/11/14 120 lb (54.432 kg)  05/05/14 126 lb (57.153 kg)  She has been smoking still due to stress but very very small amount, she states she will wait until stress gets better, she is doing better with being on chantix x 6 months.  She has been taking her anxiety medicaitons 3 times a day, due to panic attacks.  She tried adderall for anxiety but was unable to tolerate due to palpitations.  Has had amenorrhea for 1 year, will check FSH.    Current Medications:  Current Outpatient Prescriptions on File Prior to Visit  Medication Sig Dispense Refill  . acyclovir (ZOVIRAX) 400 MG tablet Take 1 tablet (400 mg total) by mouth 3 (three) times daily. 30 tablet 0  . albuterol (PROVENTIL HFA;VENTOLIN HFA) 108 (90 BASE) MCG/ACT inhaler Inhale 2 puffs into the lungs every 6 (six) hours as needed. Shortness of breath 18 g 1  . ALPRAZolam (XANAX) 0.5 MG tablet TAKE 1/2 TO 1 TABLET BY MOUTH 2 TO 3 TIMES A DAY AS NEEDED FOR ANXIETY 90 tablet 0  . atorvastatin (LIPITOR) 40 MG tablet TAKE 1 TABLET BY MOUTH DAILY 30 tablet 2  . cetirizine (ZYRTEC) 10 MG tablet Take 10 mg by mouth daily.    . CHANTIX 1 MG tablet TAKE 1 TABLET BY MOUTH TWICE A DAY 56 tablet 3  . hydrochlorothiazide (HYDRODIURIL) 25 MG tablet TAKE 1 TABLET DAILY 90  tablet 1  . metFORMIN (GLUCOPHAGE-XR) 500 MG 24 hr tablet TAKE 2 TABLETS BY MOUTH TWICE A DAY WITH FOOD FOR BLOOD SUGAR 120 tablet 2  . nystatin cream (MYCOSTATIN) Apply 1 application topically 2 (two) times daily. 30 g 0  . pantoprazole (PROTONIX) 40 MG tablet TAKE 1 TABLET BY MOUTH EVERY DAY 30 tablet 3  . sertraline (ZOLOFT) 100 MG tablet Take 200 mg by mouth daily.    Marland Kitchen triamcinolone (NASACORT) 55 MCG/ACT nasal inhaler Place 2 sprays into the nose daily as needed. allergies    . zolpidem (AMBIEN) 10 MG tablet Take 10 mg by mouth at bedtime as needed. For sleep    .  nitroGLYCERIN (NITROSTAT) 0.3 MG SL tablet Place 1 tablet (0.3 mg total) under the tongue every 5 (five) minutes as needed for chest pain. 25 tablet 1   No current facility-administered medications on file prior to visit.   Medical History:  Past Medical History  Diagnosis Date  . Hypertension   . Neuropathy   . Arthritis   . Preeclampsia complicating hypertension   . Anxiety   . Postpartum depression   . Hernia   . Prediabetes   . Vitamin D deficiency    Allergies:  Allergies  Allergen Reactions  . Ace Inhibitors     Cough  . Doxycycline Nausea And Vomiting  . Penicillins     Review of Systems:  Review of Systems  Constitutional: Negative.   HENT: Negative for congestion, ear discharge, ear pain, hearing loss, nosebleeds, sore throat and tinnitus.   Eyes: Negative.   Respiratory: Negative.  Negative for stridor.   Cardiovascular: Negative.   Gastrointestinal: Negative.   Genitourinary: Negative.   Musculoskeletal: Negative.   Skin: Negative.   Neurological: Positive for headaches.  Psychiatric/Behavioral: Positive for depression. Negative for suicidal ideas, hallucinations, memory loss and substance abuse. The patient is nervous/anxious. The patient does not have insomnia.      Family history- Review and unchanged Social history- Review and unchanged Physical Exam: BP 110/80 mmHg  Pulse 78  Temp(Src) 97.7 F (36.5 C)  Resp 18  Wt 127 lb 9.6 oz (57.879 kg) Wt Readings from Last 3 Encounters:  01/12/15 127 lb 9.6 oz (57.879 kg)  08/11/14 120 lb (54.432 kg)  05/05/14 126 lb (57.153 kg)   General Appearance: Well nourished, in no apparent distress. Eyes: PERRLA, EOMs, conjunctiva no swelling or erythema Sinuses: No Frontal/maxillary tenderness ENT/Mouth: Ext aud canals clear, TMs without erythema, bulging. No erythema, swelling, or exudate on post pharynx.  Tonsils not swollen or erythematous. Hearing normal.  Neck: Supple, thyroid normal. + TMJ tenderness.   Respiratory: Respiratory effort normal, BS equal bilaterally without rales, rhonchi, wheezing or stridor.  Cardio: RRR with no MRGs. Brisk peripheral pulses without edema.  Abdomen: Soft, + BS.  Non tender, no guarding, rebound, hernias, masses. Lymphatics: Non tender without lymphadenopathy.  Musculoskeletal: Full ROM, 5/5 strength, normal gait.  Skin: Warm, dry without rashes, lesions, ecchymosis.  Neuro: Cranial nerves intact. Normal muscle tone, no cerebellar symptoms. Sensation intact.  Psych: Awake and oriented X 3, normal affect, Insight and Judgment appropriate.    Quentin Mulling, PA-C 4:16 PM [facility]

## 2015-01-13 LAB — HEPATIC FUNCTION PANEL
ALK PHOS: 69 U/L (ref 33–115)
ALT: 12 U/L (ref 6–29)
AST: 15 U/L (ref 10–35)
Albumin: 4.1 g/dL (ref 3.6–5.1)
Bilirubin, Direct: <0.1 mg/dL (ref <=0.2)
TOTAL PROTEIN: 6.5 g/dL (ref 6.1–8.1)
Total Bilirubin: 0.3 mg/dL (ref 0.2–1.2)

## 2015-01-13 LAB — TSH: TSH: 2.086 u[IU]/mL (ref 0.350–4.500)

## 2015-01-13 LAB — LIPID PANEL
Cholesterol: 177 mg/dL (ref 125–200)
HDL: 42 mg/dL — ABNORMAL LOW (ref >=46)
LDL CALC: 74 mg/dL (ref <130)
Total CHOL/HDL Ratio: 4.2 Ratio (ref <=5.0)
Triglycerides: 303 mg/dL — ABNORMAL HIGH (ref <150)
VLDL: 61 mg/dL — AB (ref <30)

## 2015-01-13 LAB — FOLLICLE STIMULATING HORMONE: FSH: 5.5 m[IU]/mL

## 2015-01-13 LAB — INSULIN, FASTING: INSULIN FASTING, SERUM: 38.8 u[IU]/mL — AB (ref 2.0–19.6)

## 2015-01-13 LAB — BASIC METABOLIC PANEL WITH GFR
BUN: 16 mg/dL (ref 7–25)
CO2: 27 mmol/L (ref 20–31)
Calcium: 8.9 mg/dL (ref 8.6–10.2)
Chloride: 98 mmol/L (ref 98–110)
Creat: 0.99 mg/dL (ref 0.50–1.10)
GFR, EST NON AFRICAN AMERICAN: 69 mL/min (ref >=60)
GFR, Est African American: 80 mL/min (ref >=60)
GLUCOSE: 116 mg/dL — AB (ref 65–99)
POTASSIUM: 3.5 mmol/L (ref 3.5–5.3)
Sodium: 134 mmol/L — ABNORMAL LOW (ref 135–146)

## 2015-01-13 LAB — VITAMIN B12: Vitamin B-12: 366 pg/mL (ref 211–911)

## 2015-01-13 LAB — HEMOGLOBIN A1C
Hgb A1c MFr Bld: 5.7 % — ABNORMAL HIGH (ref <5.7)
MEAN PLASMA GLUCOSE: 117 mg/dL — AB (ref <117)

## 2015-01-13 LAB — MAGNESIUM: Magnesium: 1.9 mg/dL (ref 1.5–2.5)

## 2015-01-13 LAB — VITAMIN D 25 HYDROXY (VIT D DEFICIENCY, FRACTURES): Vit D, 25-Hydroxy: 27 ng/mL — ABNORMAL LOW (ref 30–100)

## 2015-03-06 ENCOUNTER — Other Ambulatory Visit: Payer: Self-pay | Admitting: Internal Medicine

## 2015-03-23 ENCOUNTER — Other Ambulatory Visit: Payer: Self-pay | Admitting: Physician Assistant

## 2015-04-08 ENCOUNTER — Other Ambulatory Visit: Payer: Self-pay | Admitting: Physician Assistant

## 2015-04-13 ENCOUNTER — Ambulatory Visit: Payer: Self-pay | Admitting: Physician Assistant

## 2015-04-15 ENCOUNTER — Encounter: Payer: Self-pay | Admitting: Internal Medicine

## 2015-04-18 ENCOUNTER — Other Ambulatory Visit: Payer: Self-pay | Admitting: Internal Medicine

## 2015-04-20 ENCOUNTER — Other Ambulatory Visit: Payer: Self-pay | Admitting: Physician Assistant

## 2015-04-20 NOTE — Telephone Encounter (Signed)
[facility] [place]

## 2015-04-22 ENCOUNTER — Other Ambulatory Visit: Payer: Self-pay | Admitting: Physician Assistant

## 2015-04-27 NOTE — Telephone Encounter (Signed)
[facility] [place].

## 2015-05-06 ENCOUNTER — Other Ambulatory Visit: Payer: Self-pay | Admitting: Internal Medicine

## 2015-05-23 ENCOUNTER — Other Ambulatory Visit: Payer: Self-pay | Admitting: Internal Medicine

## 2015-05-29 ENCOUNTER — Other Ambulatory Visit: Payer: Self-pay | Admitting: Internal Medicine

## 2015-06-14 ENCOUNTER — Ambulatory Visit (INDEPENDENT_AMBULATORY_CARE_PROVIDER_SITE_OTHER): Payer: BLUE CROSS/BLUE SHIELD | Admitting: Internal Medicine

## 2015-06-14 ENCOUNTER — Encounter: Payer: Self-pay | Admitting: Internal Medicine

## 2015-06-14 VITALS — BP 128/80 | HR 84 | Temp 98.2°F | Resp 18 | Ht 60.0 in | Wt 127.0 lb

## 2015-06-14 DIAGNOSIS — J069 Acute upper respiratory infection, unspecified: Secondary | ICD-10-CM | POA: Diagnosis not present

## 2015-06-14 MED ORDER — AZITHROMYCIN 250 MG PO TABS: ORAL_TABLET | ORAL | Status: DC

## 2015-06-14 MED ORDER — PANTOPRAZOLE SODIUM 40 MG PO TBEC
40.0000 mg | DELAYED_RELEASE_TABLET | Freq: Every day | ORAL | Status: DC
Start: 2015-06-14 — End: 2015-09-13

## 2015-06-14 MED ORDER — MOMETASONE FUROATE 50 MCG/ACT NA SUSP: 2.0000 | Freq: Every day | NASAL | Status: DC

## 2015-06-14 MED ORDER — HYDROCHLOROTHIAZIDE 25 MG PO TABS: 25.0000 mg | ORAL_TABLET | Freq: Every day | ORAL | Status: DC

## 2015-06-14 MED ORDER — ALPRAZOLAM 0.5 MG PO TABS: ORAL_TABLET | ORAL | Status: DC

## 2015-06-14 MED ORDER — ATORVASTATIN CALCIUM 40 MG PO TABS: 40.0000 mg | ORAL_TABLET | Freq: Every day | ORAL | Status: DC

## 2015-06-14 MED ORDER — VARENICLINE TARTRATE 1 MG PO TABS: 1.0000 mg | ORAL_TABLET | Freq: Two times a day (BID) | ORAL | Status: DC

## 2015-06-14 MED ORDER — PREDNISONE 20 MG PO TABS: ORAL_TABLET | ORAL | Status: DC

## 2015-06-14 MED ORDER — PROMETHAZINE-DM 6.25-15 MG/5ML PO SYRP: ORAL_SOLUTION | ORAL | Status: DC

## 2015-06-14 MED ORDER — METFORMIN HCL ER 500 MG PO TB24: ORAL_TABLET | ORAL | Status: DC

## 2015-06-14 NOTE — Progress Notes (Signed)
Patient ID: Pamela Howe, female   DOB: 1971-03-24, 45 y.o.   MRN: [id number]  HPI  Patient presents to the office for evaluation of cough and sinus congestion.  It has been going on for 1 weeks.  Patient reports night > day, wet, worse with lying down.  They also endorse change in voice, chills, fever, postnasal drip, shortness of breath and wheezing, sinus congestion, ear pain, sore throat.  They have tried advil allergies and cold.  They report that nothing has worked.  They admits to other sick contacts.  Review of Systems  Constitutional: Positive for fever. Negative for chills and malaise/fatigue.  HENT: Positive for congestion, ear pain and sore throat.   Respiratory: Positive for cough, shortness of breath and wheezing.   Cardiovascular: Negative for chest pain, palpitations and leg swelling.  Neurological: Positive for headaches.    PE:  Filed Vitals:   06/14/15 1553  BP: 128/80  Pulse: 84  Temp: 98.2 F (36.8 C)  Resp: 18   General:  Alert and non-toxic, WDWN, NAD HEENT: NCAT, PERLA, EOM normal, no occular discharge or erythema.  Nasal mucosal edema with sinus tenderness to palpation.  Oropharynx clear with minimal oropharyngeal edema and erythema.  Mucous membranes moist and pink. Neck:  Cervical adenopathy Chest:  RRR no MRGs.  Lungs clear to auscultation A&P with no wheezes rhonchi or rales.   Abdomen: +BS x 4 quadrants, soft, non-tender, no guarding, rigidity, or rebound. Skin: warm and dry no rash Neuro: A&Ox4, CN II-XII grossly intact  Assessment and Plan:   1. Acute URI  - mometasone (NASONEX) 50 MCG/ACT nasal spray; Place 2 sprays into the nose daily.  Dispense: 17 g; Refill: 2 - varenicline (CHANTIX) 1 MG tablet; Take 1 tablet (1 mg total) by mouth 2 (two) times daily.  Dispense: 56 tablet; Refill: 2 - predniSONE (DELTASONE) 20 MG tablet; 3 tabs po day one, then 2 tabs daily x 4 days  Dispense: 11 tablet; Refill: 0 - azithromycin (ZITHROMAX Z-PAK) 250 MG  tablet; 2 po day one, then 1 daily x 4 days  Dispense: 6 tablet; Refill: 0 - promethazine-dextromethorphan (PROMETHAZINE-DM) 6.25-15 MG/5ML syrup; Take 5-10 mL PO q8hrs prn for cold symptoms.  Dispense: 360 mL; Refill: 1

## 2015-06-17 ENCOUNTER — Other Ambulatory Visit: Payer: Self-pay | Admitting: Internal Medicine

## 2015-06-19 ENCOUNTER — Other Ambulatory Visit: Payer: Self-pay | Admitting: Physician Assistant

## 2015-07-16 ENCOUNTER — Other Ambulatory Visit: Payer: Self-pay | Admitting: Internal Medicine

## 2015-07-16 MED ORDER — ALPRAZOLAM 0.5 MG PO TABS
ORAL_TABLET | ORAL | Status: DC
Start: 2015-07-16 — End: 2015-08-17

## 2015-07-25 ENCOUNTER — Other Ambulatory Visit: Payer: Self-pay | Admitting: Internal Medicine

## 2015-07-25 ENCOUNTER — Other Ambulatory Visit: Payer: Self-pay | Admitting: Physician Assistant

## 2015-08-17 ENCOUNTER — Other Ambulatory Visit: Payer: Self-pay | Admitting: Internal Medicine

## 2015-09-12 ENCOUNTER — Other Ambulatory Visit: Payer: Self-pay | Admitting: Internal Medicine

## 2015-09-13 ENCOUNTER — Other Ambulatory Visit: Payer: Self-pay | Admitting: Internal Medicine

## 2015-09-13 NOTE — Telephone Encounter (Signed)
[facility]ED [facility]. 

## 2015-09-27 ENCOUNTER — Other Ambulatory Visit: Payer: Self-pay | Admitting: Internal Medicine

## 2015-10-06 ENCOUNTER — Other Ambulatory Visit: Payer: Self-pay | Admitting: Physician Assistant

## 2015-10-08 ENCOUNTER — Other Ambulatory Visit: Payer: Self-pay | Admitting: Physician Assistant

## 2015-10-12 ENCOUNTER — Other Ambulatory Visit: Payer: Self-pay | Admitting: Physician Assistant

## 2015-10-13 ENCOUNTER — Encounter: Payer: Self-pay | Admitting: Internal Medicine

## 2015-10-13 ENCOUNTER — Ambulatory Visit (INDEPENDENT_AMBULATORY_CARE_PROVIDER_SITE_OTHER): Payer: BLUE CROSS/BLUE SHIELD | Admitting: Internal Medicine

## 2015-10-13 VITALS — BP 118/82 | HR 80 | Temp 98.8°F | Resp 16 | Ht 60.0 in | Wt 128.6 lb

## 2015-10-13 DIAGNOSIS — K219 Gastro-esophageal reflux disease without esophagitis: Secondary | ICD-10-CM | POA: Diagnosis not present

## 2015-10-13 DIAGNOSIS — Z79899 Other long term (current) drug therapy: Secondary | ICD-10-CM | POA: Diagnosis not present

## 2015-10-13 DIAGNOSIS — J069 Acute upper respiratory infection, unspecified: Secondary | ICD-10-CM | POA: Diagnosis not present

## 2015-10-13 DIAGNOSIS — E559 Vitamin D deficiency, unspecified: Secondary | ICD-10-CM | POA: Diagnosis not present

## 2015-10-13 DIAGNOSIS — I10 Essential (primary) hypertension: Secondary | ICD-10-CM | POA: Diagnosis not present

## 2015-10-13 DIAGNOSIS — F411 Generalized anxiety disorder: Secondary | ICD-10-CM

## 2015-10-13 DIAGNOSIS — R7303 Prediabetes: Secondary | ICD-10-CM | POA: Diagnosis not present

## 2015-10-13 DIAGNOSIS — E782 Mixed hyperlipidemia: Secondary | ICD-10-CM | POA: Diagnosis not present

## 2015-10-13 MED ORDER — BUSPIRONE HCL 15 MG PO TABS: ORAL_TABLET | ORAL | Status: AC

## 2015-10-13 NOTE — Patient Instructions (Signed)

## 2015-10-13 NOTE — Progress Notes (Signed)
Patient ID: Pamela Howe, female   DOB: Sep 10, 1970, 45 y.o.   MRN: [id number]  [facility]                       Lucky CowboyWilliam Rameen Gohlke, M.D.        Dyanne CarrelAmanda R. Steffanie Dunnollier, P.A.-C       Terri Piedraourtney Forcucci, P.A.-C   [facility]                [address]                [place] [id number] Telephone [phone] Telefax [phone]) [phone] _________________________________________________________________________     This very nice 45 y.o.female presents for 3 month follow up with Hypertension, Hyperlipidemia, Pre-Diabetes and Vitamin D Deficiency.      Patient is treated for HTN since circa 2010 & BP has been controlled at home. Today's BP: 118/82 mmHg. Patient has had no complaints of any cardiac type chest pain, palpitations, dyspnea/orthopnea/PND, dizziness, claudication, or dependent edema.     Hyperlipidemia is controlled with diet & meds. Patient denies myalgias or other med SE's. Last Lipids were at goal with Cholesterol 177; HDL 42*; LDL 74; but elevated Triglycerides 303 on 01/12/2015.      Also, the patient has history of PreDiabetes / Insulin resistance and has had no symptoms of reactive hypoglycemia, diabetic polys, paresthesias or visual blurring.  Patient was initiated on  Metformin for Insulin resistance to also aid with weight loss. Last A1c was 5.7% on  01/12/2015.      Further, the patient also has history of Vit D Deficiency and supplements vitamin D without any suspected side-effects. Last vit D was 27 on 01/12/2015.  Medication Sig  . Acyclovir 400 MG tablet TAKE 1 TABLET (400 MG TOTAL) BY MOUTH 3 (THREE) TIMES DAILY.  Marland Kitchen. Albuterol   HFA inhaler Inhale 2 puffs into the lungs every 6 (six) hours as needed. Shortness of breath  . atorvastatin  40 MG tablet TAKE 1 TABLET (40 MG TOTAL) BY MOUTH DAILY.  . cetirizine  10 MG tablet Take 10 mg by mouth daily.  . CHANTIX 1 MG tablet TAKE 1 TABLET BY MOUTH TWICE A DAY  . hctz  25 MG tablet TAKE 1 TABLET (25 MG TOTAL) BY MOUTH DAILY.  . metFORMIN -XR 500 MG  TAKE 2 TABLETS BY MOUTH TWICE A DAY WITH FOOD FOR BLOOD SUGAR  . NASONEX nasal spray PLACE 2 SPRAYS INTO THE NOSE DAILY.  Marland Kitchen. nystatin cream  APPLY 1 APPLICATION TOPICALLY 2 (TWO) TIMES DAILY.  . pantoprazole  40 MG tablet TAKE 1 TABLET (40 MG TOTAL) BY MOUTH DAILY.  Marland Kitchen. sertraline  100 MG tablet Take 200 mg by mouth daily.  Marland Kitchen. NASACORT nasal inhaler Place 2 sprays into the nose daily as needed. allergies  . zolpidem  10 MG tablet Take 10 mg by mouth at bedtime as needed. For sleep  . pantoprazole  40 MG tablet TAKE 1 TABLET (40 MG TOTAL) BY MOUTH DAILY.   Allergies  Allergen Reactions  . Ace Inhibitors     Cough  . Doxycycline Nausea And Vomiting  . Penicillins    PMHx:   Past Medical History  Diagnosis Date  . Hypertension   . Neuropathy (HCC)   . Arthritis   . Preeclampsia complicating hypertension   . Anxiety   . Postpartum depression   . Hernia   . Prediabetes   . Vitamin  D deficiency    Immunization History  Administered Date(s) Administered  . Tdap 07/27/2010   Past Surgical History  Procedure Laterality Date  . Nerve surgery      (R) hand  . Elbow surgery    . Hernia repair    . Uterine ablation     FHx:    Reviewed / unchanged  SHx:    Reviewed / unchanged  Systems Review:  Constitutional: Denies fever, chills, wt changes, headaches, insomnia, fatigue, night sweats, change in appetite. Eyes: Denies redness, blurred vision, diplopia, discharge, itchy, watery eyes.  ENT: Denies discharge, congestion, post nasal drip, epistaxis, sore throat, earache, hearing loss, dental pain, tinnitus, vertigo, sinus pain, snoring.  CV: Denies chest pain, palpitations, irregular heartbeat, syncope, dyspnea, diaphoresis, orthopnea, PND, claudication or edema. Respiratory: denies cough, dyspnea, DOE, pleurisy, hoarseness, laryngitis, wheezing.  Gastrointestinal: Denies dysphagia, odynophagia,  heartburn, reflux, water brash, abdominal pain or cramps, nausea, vomiting, bloating, diarrhea, constipation, hematemesis, melena, hematochezia  or hemorrhoids. Genitourinary: Denies dysuria, frequency, urgency, nocturia, hesitancy, discharge, hematuria or flank pain. Musculoskeletal: Denies arthralgias, myalgias, stiffness, jt. swelling, pain, limping or strain/sprain.  Skin: Denies pruritus, rash, hives, warts, acne, eczema or change in skin lesion(s). Neuro: No weakness, tremor, incoordination, spasms, paresthesia or pain. Psychiatric: Denies confusion, memory loss or sensory loss. Endo: Denies change in weight, skin or hair change.  Heme/Lymph: No excessive bleeding, bruising or enlarged lymph nodes.  Physical Exam  BP 118/82 mmHg  Pulse 80  Temp(Src) 98.8 F (37.1 C)  Resp 16  Ht 5' (1.524 m)  Wt 128 lb 9.6 oz (58.333 kg)  BMI 25.12 kg/m2  Appears well nourished and in no distress.  Eyes: PERRLA, EOMs, conjunctiva no swelling or erythema. Sinuses: No frontal/maxillary tenderness ENT/Mouth: EAC's clear, TM's nl w/o erythema, bulging. Nares clear w/o erythema, swelling, exudates. Oropharynx clear without erythema or exudates. Oral hygiene is good. Tongue normal, non obstructing. Hearing intact.  Neck: Supple. Thyroid nl. Car 2+/2+ without bruits, nodes or JVD. Chest: Respirations nl with BS clear & equal w/o rales, rhonchi, wheezing or stridor.  Cor: Heart sounds normal w/ regular rate and rhythm without sig. murmurs, gallops, clicks, or rubs. Peripheral pulses normal and equal  without edema.  Abdomen: Soft & bowel sounds normal. Non-tender w/o guarding, rebound, hernias, masses, or organomegaly.  Lymphatics: Unremarkable.  Musculoskeletal: Full ROM all peripheral extremities, joint stability, 5/5 strength, and normal gait.  Skin: Warm, dry without exposed rashes, lesions or ecchymosis apparent.  Neuro: Cranial nerves intact, reflexes equal bilaterally. Sensory-motor testing  grossly intact. Tendon reflexes grossly intact.  Pysch: Alert & oriented x 3.  Insight and judgement nl & appropriate. No ideations.  Assessment and Plan:  1. Essential hypertension  - TSH  2. Hyperlipidemia  - Lipid panel - TSH  3. Prediabetes  - Hemoglobin A1c - Insulin, random  4. Vitamin D deficiency  - VITAMIN D 25 Hydroxy   5. Gastroesophageal reflux disease   6. Acute URI   7. Anxiety state  - busPIRone (BUSPAR) 15 MG tablet; Take 1/2 to 1 tablet 3 x / day if needed for anxiety  Dispense: 90 tablet; Refill: 1  8. Medication management  - CBC with Differential/Platelet - BASIC METABOLIC PANEL WITH GFR - Hepatic function panel - Magnesium   Recommended regular exercise, BP monitoring, weight control, and discussed med and SE's. Recommended labs to assess and monitor clinical status. Further disposition pending results of labs. Over 30 minutes of exam, counseling, chart review was performed

## 2015-10-14 LAB — BASIC METABOLIC PANEL WITH GFR
BUN: 12 mg/dL (ref 7–25)
CHLORIDE: 96 mmol/L — AB (ref 98–110)
CO2: 21 mmol/L (ref 20–31)
Calcium: 9.2 mg/dL (ref 8.6–10.2)
Creat: 0.66 mg/dL (ref 0.50–1.10)
GFR, Est Non African American: >89 mL/min (ref >=60)
Glucose, Bld: 84 mg/dL (ref 65–99)
Potassium: 3.5 mmol/L (ref 3.5–5.3)
SODIUM: 134 mmol/L — AB (ref 135–146)

## 2015-10-14 LAB — CBC WITH DIFFERENTIAL/PLATELET
BASOS PCT: 0 %
Basophils Absolute: 0 cells/uL (ref 0–200)
EOS PCT: 2 %
Eosinophils Absolute: 220 cells/uL (ref 15–500)
HCT: 39.4 % (ref 35.0–45.0)
Hemoglobin: 13.1 g/dL (ref 11.7–15.5)
LYMPHS PCT: 27 %
Lymphs Abs: 2970 cells/uL (ref 850–3900)
MCH: 25.6 pg — AB (ref 27.0–33.0)
MCHC: 33.2 g/dL (ref 32.0–36.0)
MCV: 77 fL — AB (ref 80.0–100.0)
MONOS PCT: 5 %
MPV: 8.8 fL (ref 7.5–12.5)
Monocytes Absolute: 550 cells/uL (ref 200–950)
NEUTROS ABS: 7260 {cells}/uL (ref 1500–7800)
Neutrophils Relative %: 66 %
PLATELETS: 439 10*3/uL — AB (ref 140–400)
RBC: 5.12 MIL/uL — ABNORMAL HIGH (ref 3.80–5.10)
RDW: 16.3 % — AB (ref 11.0–15.0)
WBC: 11 10*3/uL — ABNORMAL HIGH (ref 3.8–10.8)

## 2015-10-14 LAB — LIPID PANEL
CHOL/HDL RATIO: 4.6 ratio (ref <=5.0)
Cholesterol: 236 mg/dL — ABNORMAL HIGH (ref 125–200)
HDL: 51 mg/dL (ref >=46)
LDL Cholesterol: 155 mg/dL — ABNORMAL HIGH (ref <130)
TRIGLYCERIDES: 152 mg/dL — AB (ref <150)
VLDL: 30 mg/dL (ref <30)

## 2015-10-14 LAB — HEPATIC FUNCTION PANEL
ALBUMIN: 4.4 g/dL (ref 3.6–5.1)
ALT: 10 U/L (ref 6–29)
AST: 15 U/L (ref 10–35)
Alkaline Phosphatase: 66 U/L (ref 33–115)
BILIRUBIN DIRECT: 0 mg/dL (ref <=0.2)
BILIRUBIN TOTAL: 0.2 mg/dL (ref 0.2–1.2)
Indirect Bilirubin: 0.2 mg/dL (ref 0.2–1.2)
Total Protein: 7.6 g/dL (ref 6.1–8.1)

## 2015-10-14 LAB — INSULIN, RANDOM: INSULIN: 3.7 u[IU]/mL (ref 2.0–19.6)

## 2015-10-14 LAB — VITAMIN D 25 HYDROXY (VIT D DEFICIENCY, FRACTURES): VIT D 25 HYDROXY: 27 ng/mL — AB (ref 30–100)

## 2015-10-14 LAB — HEMOGLOBIN A1C
Hgb A1c MFr Bld: 5.9 % — ABNORMAL HIGH (ref <5.7)
Mean Plasma Glucose: 123 mg/dL

## 2015-10-14 LAB — TSH: TSH: 2.78 mIU/L

## 2015-10-14 LAB — MAGNESIUM: MAGNESIUM: 1.7 mg/dL (ref 1.5–2.5)

## 2015-10-15 ENCOUNTER — Encounter: Payer: Self-pay | Admitting: Internal Medicine

## 2015-12-11 ENCOUNTER — Other Ambulatory Visit: Payer: Self-pay | Admitting: Physician Assistant

## 2015-12-17 ENCOUNTER — Other Ambulatory Visit: Payer: Self-pay | Admitting: Physician Assistant

## 2015-12-22 ENCOUNTER — Encounter: Payer: Self-pay | Admitting: *Deleted

## 2015-12-28 ENCOUNTER — Other Ambulatory Visit: Payer: Self-pay | Admitting: Internal Medicine

## 2015-12-29 NOTE — Telephone Encounter (Signed)
Rx called in to pharmacy. 

## 2016-01-06 ENCOUNTER — Other Ambulatory Visit: Payer: Self-pay | Admitting: Physician Assistant

## 2016-01-07 ENCOUNTER — Other Ambulatory Visit: Payer: Self-pay | Admitting: Physician Assistant

## 2016-01-07 NOTE — Telephone Encounter (Signed)
[facility]. 

## 2016-01-12 ENCOUNTER — Other Ambulatory Visit: Payer: Self-pay | Admitting: Internal Medicine

## 2016-02-09 ENCOUNTER — Other Ambulatory Visit: Payer: Self-pay | Admitting: Physician Assistant

## 2016-02-13 ENCOUNTER — Other Ambulatory Visit: Payer: Self-pay | Admitting: Physician Assistant

## 2016-02-14 ENCOUNTER — Other Ambulatory Visit: Payer: Self-pay | Admitting: Physician Assistant

## 2016-03-02 ENCOUNTER — Other Ambulatory Visit: Payer: Self-pay | Admitting: Physician Assistant

## 2016-03-09 ENCOUNTER — Ambulatory Visit (INDEPENDENT_AMBULATORY_CARE_PROVIDER_SITE_OTHER): Payer: BLUE CROSS/BLUE SHIELD | Admitting: Physician Assistant

## 2016-03-09 ENCOUNTER — Encounter: Payer: Self-pay | Admitting: Physician Assistant

## 2016-03-09 VITALS — BP 130/80 | HR 101 | Temp 97.3°F | Resp 14 | Ht 60.0 in | Wt 134.8 lb

## 2016-03-09 DIAGNOSIS — I10 Essential (primary) hypertension: Secondary | ICD-10-CM | POA: Diagnosis not present

## 2016-03-09 DIAGNOSIS — F17200 Nicotine dependence, unspecified, uncomplicated: Secondary | ICD-10-CM

## 2016-03-09 DIAGNOSIS — E559 Vitamin D deficiency, unspecified: Secondary | ICD-10-CM

## 2016-03-09 DIAGNOSIS — R7303 Prediabetes: Secondary | ICD-10-CM

## 2016-03-09 DIAGNOSIS — R05 Cough: Secondary | ICD-10-CM | POA: Diagnosis not present

## 2016-03-09 DIAGNOSIS — E782 Mixed hyperlipidemia: Secondary | ICD-10-CM | POA: Diagnosis not present

## 2016-03-09 DIAGNOSIS — F419 Anxiety disorder, unspecified: Secondary | ICD-10-CM | POA: Diagnosis not present

## 2016-03-09 DIAGNOSIS — Z79899 Other long term (current) drug therapy: Secondary | ICD-10-CM

## 2016-03-09 DIAGNOSIS — R059 Cough, unspecified: Secondary | ICD-10-CM

## 2016-03-09 LAB — HEMOGLOBIN A1C
Hgb A1c MFr Bld: 5.6 % (ref <5.7)
Mean Plasma Glucose: 114 mg/dL

## 2016-03-09 LAB — CBC WITH DIFFERENTIAL/PLATELET
BASOS PCT: 0 %
Basophils Absolute: 0 cells/uL (ref 0–200)
EOS ABS: 93 {cells}/uL (ref 15–500)
Eosinophils Relative: 1 %
HCT: 36.4 % (ref 35.0–45.0)
HEMOGLOBIN: 11.9 g/dL (ref 11.7–15.5)
LYMPHS ABS: 2790 {cells}/uL (ref 850–3900)
Lymphocytes Relative: 30 %
MCH: 24.6 pg — ABNORMAL LOW (ref 27.0–33.0)
MCHC: 32.7 g/dL (ref 32.0–36.0)
MCV: 75.2 fL — AB (ref 80.0–100.0)
MONOS PCT: 6 %
MPV: 8.7 fL (ref 7.5–12.5)
Monocytes Absolute: 558 cells/uL (ref 200–950)
NEUTROS ABS: 5859 {cells}/uL (ref 1500–7800)
Neutrophils Relative %: 63 %
PLATELETS: 404 10*3/uL — AB (ref 140–400)
RBC: 4.84 MIL/uL (ref 3.80–5.10)
RDW: 17.5 % — ABNORMAL HIGH (ref 11.0–15.0)
WBC: 9.3 10*3/uL (ref 3.8–10.8)

## 2016-03-09 MED ORDER — ALBUTEROL SULFATE HFA 108 (90 BASE) MCG/ACT IN AERS: 2.0000 | INHALATION_SPRAY | Freq: Four times a day (QID) | RESPIRATORY_TRACT | 1 refills | Status: DC | PRN

## 2016-03-09 MED ORDER — PANTOPRAZOLE SODIUM 40 MG PO TBEC: DELAYED_RELEASE_TABLET | ORAL | 3 refills | Status: DC

## 2016-03-09 MED ORDER — PROMETHAZINE-CODEINE 6.25-10 MG/5ML PO SYRP: 5.0000 mL | ORAL_SOLUTION | Freq: Four times a day (QID) | ORAL | 0 refills | Status: DC | PRN

## 2016-03-09 MED ORDER — HYDROCHLOROTHIAZIDE 25 MG PO TABS: ORAL_TABLET | ORAL | 0 refills | Status: DC

## 2016-03-09 MED ORDER — METFORMIN HCL ER 500 MG PO TB24: ORAL_TABLET | ORAL | 3 refills | Status: DC

## 2016-03-09 MED ORDER — ALPRAZOLAM 0.5 MG PO TABS: ORAL_TABLET | ORAL | 0 refills | Status: DC

## 2016-03-09 NOTE — Progress Notes (Signed)
Assessment and Plan:   Essential hypertension - continue medications, DASH diet, exercise and monitor at home. Call if greater than 130/80.  -     BASIC METABOLIC PANEL WITH GFR -     CBC with Differential/Platelet -     Hepatic function panel -     TSH -     hydrochlorothiazide (HYDRODIURIL) 25 MG tablet; TAKE 1 TABLET (25 MG TOTAL) BY MOUTH DAILY.  Prediabetes Discussed general issues about diabetes pathophysiology and management., Educational material distributed., Suggested low cholesterol diet., Encouraged aerobic exercise., Discussed foot care., Reminded to get yearly retinal exam -     Hemoglobin A1c -     metFORMIN (GLUCOPHAGE-XR) 500 MG 24 hr tablet; TAKE 2 TABLETS BY MOUTH TWICE A DAY WITH FOOD FOR BLOOD SUGAR  Hyperlipidemia -continue medications, check lipids, decrease fatty foods, increase activity.  -     Lipid panel -     pantoprazole (PROTONIX) 40 MG tablet; TAKE 1 TABLET (40 MG TOTAL) BY MOUTH DAILY.  Medication management -     Magnesium  Tobacco use disorder Discussed stop smoking, continue chantix  Anxiety LONG discussion about abuse, will not take daily, continue zoloft -     ALPRAZolam (XANAX) 0.5 MG tablet; TAKE 1/2-1 TABLET BY MOUTH 2 TIMES DAILY FOR EXTREME ANXIETY. NOT A DAILY MEDICATION  Cough Getting better likely viral, if not improved will get CXR, if gets worse will send in ABX -     albuterol (PROVENTIL HFA;VENTOLIN HFA) 108 (90 Base) MCG/ACT inhaler; Inhale 2 puffs into the lungs every 6 (six) hours as needed for shortness of breath. -     promethazine-codeine (PHENERGAN WITH CODEINE) 6.25-10 MG/5ML syrup; Take 5 mLs by mouth every 6 (six) hours as needed for cough. Max: 20mL per day     Continue diet and meds as discussed. Further disposition pending results of labs.  HPI 45 y.o. female  presents for 3 month follow up with hypertension, hyperlipidemia, prediabetes and vitamin D. Her blood pressure has been controlled at home, today their BP  is BP: 130/80 She does not workout. She denies chest pain, shortness of breath, dizziness.  She is on cholesterol medication, she is on 40mg  once daily and denies myalgias. Her cholesterol is not at goal. The cholesterol last visit was:   Lab Results  Component Value Date   CHOL 236 (H) 10/13/2015   HDL 51 10/13/2015   LDLCALC 155 (H) 10/13/2015   TRIG 152 (H) 10/13/2015   CHOLHDL 4.6 10/13/2015   She has been working on diet and exercise for prediabetes, and denies paresthesia of the feet, polydipsia, polyuria and visual disturbances. Last A1C in the office was:  Lab Results  Component Value Date   HGBA1C 5.9 (H) 10/13/2015   Patient is on Vitamin D supplement, can not take due to being sick.   Lab Results  Component Value Date   VD25OH 27 (L) 10/13/2015     BMI is Body mass index is 26.33 kg/m., she is working on diet and exercise,she was on phentermine for weight loss, doing well but has been on the needed amount of time. She has gained 3 lbs but has been under increased stress, her house has been in foreclosure, had to move. Son;s finance about to have a baby, son has been having sobriety issues so she is watching grandson.  She is on zoloft, only taking as needed.  Wt Readings from Last 3 Encounters:  03/09/16 134 lb 12.8 oz (61.1 kg)  10/13/15 128 lb 9.6 oz (58.3 kg)  06/14/15 127 lb (57.6 kg)  She has been smoking still due to stress but very very small amount, she states she will wait until stress gets better, she is doing better with being on chantix x 6 months. She has had sinus infection x 2 weeks, improving, only thing now is cough, no fever, chills, no SOB/wheezing.    Current Medications:  Current Outpatient Prescriptions on File Prior to Visit  Medication Sig Dispense Refill  . acyclovir (ZOVIRAX) 400 MG tablet TAKE 1 TABLET (400 MG TOTAL) BY MOUTH 3 (THREE) TIMES DAILY. 30 tablet 3  . atorvastatin (LIPITOR) 40 MG tablet TAKE 1 TABLET (40 MG TOTAL) BY MOUTH DAILY. 30  tablet 2  . cetirizine (ZYRTEC) 10 MG tablet Take 10 mg by mouth daily.    . CHANTIX 1 MG tablet TAKE 1 TABLET BY MOUTH TWICE A DAY 56 tablet 2  . diphenhydrAMINE (BENADRYL) 25 MG tablet Take 25 mg by mouth every 6 (six) hours as needed. Takes 1 tablet daily PRN for allergies.    . mometasone (NASONEX) 50 MCG/ACT nasal spray PLACE 2 SPRAYS INTO THE NOSE DAILY. 17 g 2  . nystatin cream (MYCOSTATIN) APPLY 1 APPLICATION TOPICALLY 2 (TWO) TIMES DAILY. 60 g 3  . sertraline (ZOLOFT) 100 MG tablet Take 200 mg by mouth daily.    Marland Kitchen. triamcinolone (NASACORT) 55 MCG/ACT nasal inhaler Place 2 sprays into the nose daily as needed. allergies    . zolpidem (AMBIEN) 10 MG tablet Take 10 mg by mouth at bedtime as needed. For sleep    . nitroGLYCERIN (NITROSTAT) 0.3 MG SL tablet Place 1 tablet (0.3 mg total) under the tongue every 5 (five) minutes as needed for chest pain. 25 tablet 1   No current facility-administered medications on file prior to visit.    Medical History:  Past Medical History:  Diagnosis Date  . Anxiety   . Arthritis   . Hernia   . Hypertension   . Neuropathy (HCC)   . Postpartum depression   . Prediabetes   . Preeclampsia complicating hypertension   . Vitamin D deficiency    Allergies:  Allergies  Allergen Reactions  . Ace Inhibitors     Cough  . Doxycycline Nausea And Vomiting  . Penicillins     Review of Systems:  Review of Systems  Constitutional: Negative.   HENT: Negative for congestion, ear discharge, ear pain, hearing loss, nosebleeds, sore throat and tinnitus.   Eyes: Negative.   Respiratory: Positive for cough. Negative for hemoptysis, sputum production, shortness of breath, wheezing and stridor.   Cardiovascular: Negative.   Gastrointestinal: Negative.   Genitourinary: Negative.   Musculoskeletal: Negative.   Skin: Negative.   Neurological: Negative for headaches.  Psychiatric/Behavioral: Negative for depression, hallucinations, memory loss, substance abuse  and suicidal ideas. The patient is nervous/anxious. The patient does not have insomnia.      Family history- Review and unchanged Social history- Review and unchanged Physical Exam: BP 130/80   Pulse (!) 101   Temp 97.3 F (36.3 C)   Resp 14   Ht 5' (1.524 m)   Wt 134 lb 12.8 oz (61.1 kg)   SpO2 99%   BMI 26.33 kg/m  Wt Readings from Last 3 Encounters:  03/09/16 134 lb 12.8 oz (61.1 kg)  10/13/15 128 lb 9.6 oz (58.3 kg)  06/14/15 127 lb (57.6 kg)   General Appearance: Well nourished, in no apparent distress. Eyes: PERRLA, EOMs, conjunctiva no  swelling or erythema Sinuses: No Frontal/maxillary tenderness ENT/Mouth: Ext aud canals clear, TMs without erythema, bulging. No erythema, swelling, or exudate on post pharynx.  Tonsils not swollen or erythematous. Hearing normal.  Neck: Supple, thyroid normal. + TMJ tenderness.  Respiratory: Respiratory effort normal, BS equal bilaterally without rales, rhonchi, wheezing or stridor.  Cardio: RRR with no MRGs. Brisk peripheral pulses without edema.  Abdomen: Soft, + BS.  Non tender, no guarding, rebound, hernias, masses. Lymphatics: Non tender without lymphadenopathy.  Musculoskeletal: Full ROM, 5/5 strength, normal gait.  Skin: Warm, dry without rashes, lesions, ecchymosis.  Neuro: Cranial nerves intact. Normal muscle tone, no cerebellar symptoms. Sensation intact.  Psych: Awake and oriented X 3, normal affect, Insight and Judgment appropriate.    Quentin MullingAmanda Herndon Grill, PA-C 3:05 PM [facility]

## 2016-03-09 NOTE — Patient Instructions (Signed)

## 2016-03-10 LAB — HEPATIC FUNCTION PANEL
ALK PHOS: 83 U/L (ref 33–115)
ALT: 16 U/L (ref 6–29)
AST: 18 U/L (ref 10–35)
Albumin: 4.4 g/dL (ref 3.6–5.1)
BILIRUBIN INDIRECT: 0.2 mg/dL (ref 0.2–1.2)
BILIRUBIN TOTAL: 0.2 mg/dL (ref 0.2–1.2)
Bilirubin, Direct: 0 mg/dL (ref <=0.2)
TOTAL PROTEIN: 7.2 g/dL (ref 6.1–8.1)

## 2016-03-10 LAB — BASIC METABOLIC PANEL WITH GFR
BUN: 12 mg/dL (ref 7–25)
CALCIUM: 9.3 mg/dL (ref 8.6–10.2)
CHLORIDE: 100 mmol/L (ref 98–110)
CO2: 26 mmol/L (ref 20–31)
Creat: 0.69 mg/dL (ref 0.50–1.10)
GFR, Est African American: >89 mL/min (ref >=60)
Glucose, Bld: 105 mg/dL — ABNORMAL HIGH (ref 65–99)
POTASSIUM: 4 mmol/L (ref 3.5–5.3)
SODIUM: 137 mmol/L (ref 135–146)

## 2016-03-10 LAB — LIPID PANEL
Cholesterol: 218 mg/dL — ABNORMAL HIGH (ref <200)
HDL: 47 mg/dL — AB (ref >50)
LDL CALC: 104 mg/dL — AB
TRIGLYCERIDES: 335 mg/dL — AB (ref <150)
Total CHOL/HDL Ratio: 4.6 Ratio (ref <5.0)
VLDL: 67 mg/dL — AB (ref <30)

## 2016-03-10 LAB — TSH: TSH: 2.3 mIU/L

## 2016-03-10 LAB — MAGNESIUM: Magnesium: 2 mg/dL (ref 1.5–2.5)

## 2016-03-13 ENCOUNTER — Encounter: Payer: Self-pay | Admitting: Physician Assistant

## 2016-03-14 ENCOUNTER — Other Ambulatory Visit: Payer: Self-pay | Admitting: Physician Assistant

## 2016-03-14 MED ORDER — AZITHROMYCIN 250 MG PO TABS: ORAL_TABLET | ORAL | 1 refills | Status: AC

## 2016-04-05 ENCOUNTER — Other Ambulatory Visit: Payer: Self-pay | Admitting: Physician Assistant

## 2016-04-05 DIAGNOSIS — F419 Anxiety disorder, unspecified: Secondary | ICD-10-CM

## 2016-04-06 NOTE — Telephone Encounter (Signed)
Xanax was called into [facility] pharmacy @ 8:25am

## 2016-04-06 NOTE — Telephone Encounter (Signed)
Xanax was called into [facility] pharmacy @ 8:25am 

## 2016-04-17 ENCOUNTER — Other Ambulatory Visit: Payer: Self-pay | Admitting: Physician Assistant

## 2016-05-02 ENCOUNTER — Other Ambulatory Visit: Payer: Self-pay | Admitting: Physician Assistant

## 2016-05-02 DIAGNOSIS — R7303 Prediabetes: Secondary | ICD-10-CM

## 2016-05-29 ENCOUNTER — Other Ambulatory Visit: Payer: Self-pay | Admitting: Physician Assistant

## 2016-05-29 DIAGNOSIS — I10 Essential (primary) hypertension: Secondary | ICD-10-CM

## 2016-05-31 ENCOUNTER — Other Ambulatory Visit: Payer: Self-pay | Admitting: Physician Assistant

## 2016-05-31 DIAGNOSIS — F419 Anxiety disorder, unspecified: Secondary | ICD-10-CM

## 2016-05-31 NOTE — Telephone Encounter (Signed)
Xanax called into pharmacy & informed pharmacy not to fill until Friday.

## 2016-06-06 ENCOUNTER — Ambulatory Visit: Payer: Self-pay | Admitting: Physician Assistant

## 2016-06-06 NOTE — Progress Notes (Deleted)
Assessment and Plan:   Essential hypertension - continue medications, DASH diet, exercise and monitor at home. Call if greater than 130/80.  -     BASIC METABOLIC PANEL WITH GFR -     CBC with Differential/Platelet -     Hepatic function panel -     TSH -     hydrochlorothiazide (HYDRODIURIL) 25 MG tablet; TAKE 1 TABLET (25 MG TOTAL) BY MOUTH DAILY.  Prediabetes Discussed general issues about diabetes pathophysiology and management., Educational material distributed., Suggested low cholesterol diet., Encouraged aerobic exercise., Discussed foot care., Reminded to get yearly retinal exam -     Hemoglobin A1c -     metFORMIN (GLUCOPHAGE-XR) 500 MG 24 hr tablet; TAKE 2 TABLETS BY MOUTH TWICE A DAY WITH FOOD FOR BLOOD SUGAR  Hyperlipidemia -continue medications, check lipids, decrease fatty foods, increase activity.  -     Lipid panel -     pantoprazole (PROTONIX) 40 MG tablet; TAKE 1 TABLET (40 MG TOTAL) BY MOUTH DAILY.  Medication management -     Magnesium  Tobacco use disorder Discussed stop smoking, continue chantix  Anxiety LONG discussion about abuse, will not take daily, continue zoloft -     ALPRAZolam (XANAX) 0.5 MG tablet; TAKE 1/2-1 TABLET BY MOUTH 2 TIMES DAILY FOR EXTREME ANXIETY. NOT A DAILY MEDICATION  Cough Getting better likely viral, if not improved will get CXR, if gets worse will send in ABX -     albuterol (PROVENTIL HFA;VENTOLIN HFA) 108 (90 Base) MCG/ACT inhaler; Inhale 2 puffs into the lungs every 6 (six) hours as needed for shortness of breath. -     promethazine-codeine (PHENERGAN WITH CODEINE) 6.25-10 MG/5ML syrup; Take 5 mLs by mouth every 6 (six) hours as needed for cough. Max: 20mL per day     Continue diet and meds as discussed. Further disposition pending results of labs.  HPI 46 y.o. female  presents for 3 month follow up with hypertension, hyperlipidemia, prediabetes and vitamin D. Her blood pressure has been controlled at home, today their BP  is   She does not workout. She denies chest pain, shortness of breath, dizziness.  She is on cholesterol medication, she is on 40mg  once daily and denies myalgias. Her cholesterol is not at goal. The cholesterol last visit was:   Lab Results  Component Value Date   CHOL 218 (H) 03/09/2016   HDL 47 (L) 03/09/2016   LDLCALC 104 (H) 03/09/2016   TRIG 335 (H) 03/09/2016   CHOLHDL 4.6 03/09/2016   She has been working on diet and exercise for prediabetes, and denies paresthesia of the feet, polydipsia, polyuria and visual disturbances. Last A1C in the office was:  Lab Results  Component Value Date   HGBA1C 5.6 03/09/2016   Patient is on Vitamin D supplement, can not take due to being sick.   Lab Results  Component Value Date   VD25OH 27 (L) 10/13/2015     BMI is There is no height or weight on file to calculate BMI., she is working on diet and exercise,she was on phentermine for weight loss, doing well but has been on the needed amount of time. She has gained 3 lbs but has been under increased stress, her house has been in foreclosure, had to move. Son;s finance about to have a baby, son has been having sobriety issues so she is watching grandson.  She is on zoloft, only taking as needed.  Wt Readings from Last 3 Encounters:  03/09/16 134 lb  12.8 oz (61.1 kg)  10/13/15 128 lb 9.6 oz (58.3 kg)  06/14/15 127 lb (57.6 kg)  She has been smoking still due to stress but very very small amount, she states she will wait until stress gets better, she is doing better with being on chantix x 6 months. She has had sinus infection x 2 weeks, improving, only thing now is cough, no fever, chills, no SOB/wheezing.    Current Medications:  Current Outpatient Prescriptions on File Prior to Visit  Medication Sig Dispense Refill  . acyclovir (ZOVIRAX) 400 MG tablet TAKE 1 TABLET (400 MG TOTAL) BY MOUTH 3 (THREE) TIMES DAILY. 30 tablet 3  . albuterol (PROVENTIL HFA;VENTOLIN HFA) 108 (90 Base) MCG/ACT inhaler  Inhale 2 puffs into the lungs every 6 (six) hours as needed for shortness of breath. 18 g 1  . ALPRAZolam (XANAX) 0.5 MG tablet TAKE 1/2 TO 1 TABLET BY MOUTH 2 TIMES DAILY FOR EXTREME ANXIETY. NOT A DAILY MEDICATION 60 tablet 1  . atorvastatin (LIPITOR) 40 MG tablet TAKE 1 TABLET (40 MG TOTAL) BY MOUTH DAILY. 30 tablet 2  . cetirizine (ZYRTEC) 10 MG tablet Take 10 mg by mouth daily.    . CHANTIX 1 MG tablet TAKE 1 TABLET BY MOUTH TWICE A DAY 56 tablet 2  . CHANTIX 1 MG tablet TAKE 1 TABLET BY MOUTH TWICE A DAY 56 tablet 2  . diphenhydrAMINE (BENADRYL) 25 MG tablet Take 25 mg by mouth every 6 (six) hours as needed. Takes 1 tablet daily PRN for allergies.    . hydrochlorothiazide (HYDRODIURIL) 25 MG tablet TAKE 1 TABLET (25 MG TOTAL) BY MOUTH DAILY. 90 tablet 0  . metFORMIN (GLUCOPHAGE-XR) 500 MG 24 hr tablet TAKE 2 TABLETS BY MOUTH TWICE A DAY WITH FOOD FOR BLOOD SUGAR 60 tablet 3  . mometasone (NASONEX) 50 MCG/ACT nasal spray PLACE 2 SPRAYS INTO THE NOSE DAILY. 17 g 2  . nitroGLYCERIN (NITROSTAT) 0.3 MG SL tablet Place 1 tablet (0.3 mg total) under the tongue every 5 (five) minutes as needed for chest pain. 25 tablet 1  . nystatin cream (MYCOSTATIN) APPLY 1 APPLICATION TOPICALLY 2 (TWO) TIMES DAILY. 60 g 3  . pantoprazole (PROTONIX) 40 MG tablet TAKE 1 TABLET (40 MG TOTAL) BY MOUTH DAILY. 30 tablet 3  . promethazine-codeine (PHENERGAN WITH CODEINE) 6.25-10 MG/5ML syrup Take 5 mLs by mouth every 6 (six) hours as needed for cough. Max: 20mL per day 180 mL 0  . sertraline (ZOLOFT) 100 MG tablet Take 200 mg by mouth daily.    Marland Kitchen triamcinolone (NASACORT) 55 MCG/ACT nasal inhaler Place 2 sprays into the nose daily as needed. allergies    . zolpidem (AMBIEN) 10 MG tablet Take 10 mg by mouth at bedtime as needed. For sleep     No current facility-administered medications on file prior to visit.    Medical History:  Past Medical History:  Diagnosis Date  . Anxiety   . Arthritis   . Hernia   .  Hypertension   . Neuropathy (HCC)   . Postpartum depression   . Prediabetes   . Preeclampsia complicating hypertension   . Vitamin D deficiency    Allergies:  Allergies  Allergen Reactions  . Ace Inhibitors     Cough  . Doxycycline Nausea And Vomiting  . Penicillins     Review of Systems:  Review of Systems  Constitutional: Negative.   HENT: Negative for congestion, ear discharge, ear pain, hearing loss, nosebleeds, sore throat and tinnitus.  Eyes: Negative.   Respiratory: Positive for cough. Negative for hemoptysis, sputum production, shortness of breath, wheezing and stridor.   Cardiovascular: Negative.   Gastrointestinal: Negative.   Genitourinary: Negative.   Musculoskeletal: Negative.   Skin: Negative.   Neurological: Negative for headaches.  Psychiatric/Behavioral: Negative for depression, hallucinations, memory loss, substance abuse and suicidal ideas. The patient is nervous/anxious. The patient does not have insomnia.      Family history- Review and unchanged Social history- Review and unchanged Physical Exam: There were no vitals taken for this visit. Wt Readings from Last 3 Encounters:  03/09/16 134 lb 12.8 oz (61.1 kg)  10/13/15 128 lb 9.6 oz (58.3 kg)  06/14/15 127 lb (57.6 kg)   General Appearance: Well nourished, in no apparent distress. Eyes: PERRLA, EOMs, conjunctiva no swelling or erythema Sinuses: No Frontal/maxillary tenderness ENT/Mouth: Ext aud canals clear, TMs without erythema, bulging. No erythema, swelling, or exudate on post pharynx.  Tonsils not swollen or erythematous. Hearing normal.  Neck: Supple, thyroid normal. + TMJ tenderness.  Respiratory: Respiratory effort normal, BS equal bilaterally without rales, rhonchi, wheezing or stridor.  Cardio: RRR with no MRGs. Brisk peripheral pulses without edema.  Abdomen: Soft, + BS.  Non tender, no guarding, rebound, hernias, masses. Lymphatics: Non tender without lymphadenopathy.  Musculoskeletal:  Full ROM, 5/5 strength, normal gait.  Skin: Warm, dry without rashes, lesions, ecchymosis.  Neuro: Cranial nerves intact. Normal muscle tone, no cerebellar symptoms. Sensation intact.  Psych: Awake and oriented X 3, normal affect, Insight and Judgment appropriate.    Quentin MullingAmanda Josefine Fuhr, PA-C 12:57 PM [facility]

## 2016-06-27 ENCOUNTER — Other Ambulatory Visit: Payer: Self-pay | Admitting: Physician Assistant

## 2016-06-27 DIAGNOSIS — E782 Mixed hyperlipidemia: Secondary | ICD-10-CM

## 2016-07-10 ENCOUNTER — Other Ambulatory Visit: Payer: Self-pay | Admitting: Physician Assistant

## 2016-07-10 DIAGNOSIS — R7303 Prediabetes: Secondary | ICD-10-CM

## 2016-07-22 ENCOUNTER — Other Ambulatory Visit: Payer: Self-pay | Admitting: Physician Assistant

## 2016-07-22 DIAGNOSIS — R7303 Prediabetes: Secondary | ICD-10-CM

## 2016-07-22 DIAGNOSIS — E782 Mixed hyperlipidemia: Secondary | ICD-10-CM

## 2016-07-25 ENCOUNTER — Other Ambulatory Visit: Payer: Self-pay | Admitting: Internal Medicine

## 2016-07-26 ENCOUNTER — Ambulatory Visit (INDEPENDENT_AMBULATORY_CARE_PROVIDER_SITE_OTHER): Payer: BLUE CROSS/BLUE SHIELD | Admitting: Internal Medicine

## 2016-07-26 ENCOUNTER — Encounter: Payer: Self-pay | Admitting: Internal Medicine

## 2016-07-26 VITALS — BP 128/82 | HR 64 | Temp 98.0°F | Resp 18 | Ht 60.0 in | Wt 126.0 lb

## 2016-07-26 DIAGNOSIS — F419 Anxiety disorder, unspecified: Secondary | ICD-10-CM | POA: Diagnosis not present

## 2016-07-26 DIAGNOSIS — E559 Vitamin D deficiency, unspecified: Secondary | ICD-10-CM | POA: Diagnosis not present

## 2016-07-26 DIAGNOSIS — R7303 Prediabetes: Secondary | ICD-10-CM | POA: Diagnosis not present

## 2016-07-26 DIAGNOSIS — E782 Mixed hyperlipidemia: Secondary | ICD-10-CM

## 2016-07-26 DIAGNOSIS — M542 Cervicalgia: Secondary | ICD-10-CM

## 2016-07-26 DIAGNOSIS — Z79899 Other long term (current) drug therapy: Secondary | ICD-10-CM | POA: Diagnosis not present

## 2016-07-26 DIAGNOSIS — I10 Essential (primary) hypertension: Secondary | ICD-10-CM | POA: Diagnosis not present

## 2016-07-26 DIAGNOSIS — Z720 Tobacco use: Secondary | ICD-10-CM

## 2016-07-26 MED ORDER — METFORMIN HCL ER 500 MG PO TB24: 500.0000 mg | ORAL_TABLET | Freq: Two times a day (BID) | ORAL | 0 refills | Status: DC

## 2016-07-26 MED ORDER — CYCLOBENZAPRINE HCL 10 MG PO TABS: 10.0000 mg | ORAL_TABLET | Freq: Three times a day (TID) | ORAL | 0 refills | Status: DC | PRN

## 2016-07-26 MED ORDER — ALPRAZOLAM 0.5 MG PO TABS: 0.5000 mg | ORAL_TABLET | Freq: Two times a day (BID) | ORAL | 1 refills | Status: DC | PRN

## 2016-07-26 MED ORDER — PANTOPRAZOLE SODIUM 40 MG PO TBEC: 40.0000 mg | DELAYED_RELEASE_TABLET | Freq: Every day | ORAL | 0 refills | Status: DC

## 2016-07-26 NOTE — Progress Notes (Signed)
Assessment and Plan:  Hypertension:  -Continue medication,  -monitor blood pressure at home.  -Continue DASH diet.   -Reminder to go to the ER if any CP, SOB, nausea, dizziness, severe HA, changes vision/speech, left arm numbness and tingling, and jaw pain.  Cholesterol: -Continue diet and exercise.   Pre-diabetes: -Continue diet and exercise.  -cont metformin as prophylaxis and weight management  Vitamin D Def: -continue medications.   GAD -xanax refilled after consulting [facility] -recommended taking sparingly -do not take xanax and ambien together  Tobacco abuse -continued smoking -not currently taking chantix  Neck pain -trigger point injections given here in the trapezius and in the neck.   -flexeril at bedtime prn -heat  Continue diet and meds as discussed. Further disposition pending results of labs.  HPI 46 y.o. female  presents for 3 month follow up with hypertension, hyperlipidemia, prediabetes and vitamin D.   Her blood pressure has been controlled at home, today their BP is BP: 128/82.   She does not workout. She denies chest pain, shortness of breath, dizziness.   She is not on cholesterol medication and denies myalgias. Her cholesterol is not at goal. The cholesterol last visit was:   Lab Results  Component Value Date   CHOL 218 (H) 03/09/2016   HDL 47 (L) 03/09/2016   LDLCALC 104 (H) 03/09/2016   TRIG 335 (H) 03/09/2016   CHOLHDL 4.6 03/09/2016     She has been working on diet and exercise for prediabetes, and denies foot ulcerations, hyperglycemia, hypoglycemia , increased appetite, nausea, paresthesia of the feet, polydipsia, polyuria, visual disturbances, vomiting and weight loss. Last A1C in the office was:  Lab Results  Component Value Date   HGBA1C 5.6 03/09/2016  Her obgyn is currently prescribing phentermine.   Patient is on Vitamin D supplement.  Lab Results  Component Value Date   VD25OH 27 (L) 10/13/2015      Patient reports  that she is having right shoulder pain which has been going on for the last week.  She reports that she had to split up two dogs and they pulled on her arm.  She has been having muscle spasms.  She reports the pain goes in her neck and to her shoulder.  She has tried heat and ice, taking ibuprofen and tylenol.  She reports that she is alternating these.  She reports that she is going on Vacation on Friday.  She is right hand dominant.  She is not having tingling or numbness.    She has been having issues with her son who was recently bipolar.  She reports that things have been very tense.  He has tried several other times since then to harm himself.  They have had to take over custody of their grandson.    Current Medications:  Current Outpatient Prescriptions on File Prior to Visit  Medication Sig Dispense Refill  . acyclovir (ZOVIRAX) 400 MG tablet TAKE 1 TABLET (400 MG TOTAL) BY MOUTH 3 (THREE) TIMES DAILY. 30 tablet 3  . albuterol (PROVENTIL HFA;VENTOLIN HFA) 108 (90 Base) MCG/ACT inhaler Inhale 2 puffs into the lungs every 6 (six) hours as needed for shortness of breath. 18 g 1  . ALPRAZolam (XANAX) 0.5 MG tablet TAKE 1/2 TO 1 TABLET BY MOUTH 2 TIMES DAILY FOR EXTREME ANXIETY. NOT A DAILY MEDICATION 60 tablet 1  . atorvastatin (LIPITOR) 40 MG tablet TAKE 1 TABLET (40 MG TOTAL) BY MOUTH DAILY. 30 tablet 0  . cetirizine (ZYRTEC) 10 MG  tablet Take 10 mg by mouth daily.    . CHANTIX 1 MG tablet TAKE 1 TABLET BY MOUTH TWICE A DAY 56 tablet 0  . diphenhydrAMINE (BENADRYL) 25 MG tablet Take 25 mg by mouth every 6 (six) hours as needed. Takes 1 tablet daily PRN for allergies.    . hydrochlorothiazide (HYDRODIURIL) 25 MG tablet TAKE 1 TABLET (25 MG TOTAL) BY MOUTH DAILY. 90 tablet 0  . metFORMIN (GLUCOPHAGE-XR) 500 MG 24 hr tablet TAKE 2 TABLETS BY MOUTH TWICE A DAY WITH FOOD FOR BLOOD SUGAR 60 tablet 0  . mometasone (NASONEX) 50 MCG/ACT nasal spray PLACE 2 SPRAYS INTO THE NOSE DAILY. 17 g 2  . nystatin  cream (MYCOSTATIN) APPLY 1 APPLICATION TOPICALLY 2 (TWO) TIMES DAILY. 60 g 3  . pantoprazole (PROTONIX) 40 MG tablet TAKE 1 TABLET (40 MG TOTAL) BY MOUTH DAILY. 30 tablet 0  . sertraline (ZOLOFT) 100 MG tablet Take 200 mg by mouth daily.    Marland Kitchen triamcinolone (NASACORT) 55 MCG/ACT nasal inhaler Place 2 sprays into the nose daily as needed. allergies    . zolpidem (AMBIEN) 10 MG tablet Take 10 mg by mouth at bedtime as needed. For sleep    . nitroGLYCERIN (NITROSTAT) 0.3 MG SL tablet Place 1 tablet (0.3 mg total) under the tongue every 5 (five) minutes as needed for chest pain. 25 tablet 1   No current facility-administered medications on file prior to visit.     Medical History:  Past Medical History:  Diagnosis Date  . Anxiety   . Arthritis   . Hernia   . Hypertension   . Neuropathy (HCC)   . Postpartum depression   . Prediabetes   . Preeclampsia complicating hypertension   . Vitamin D deficiency     Allergies:  Allergies  Allergen Reactions  . Ace Inhibitors     Cough  . Doxycycline Nausea And Vomiting  . Penicillins      Review of Systems:  Review of Systems  Constitutional: Negative for chills, fever and malaise/fatigue.  HENT: Negative for congestion, ear pain and sore throat.   Eyes: Negative.   Respiratory: Negative for cough, shortness of breath and wheezing.   Cardiovascular: Negative for chest pain, palpitations and leg swelling.  Gastrointestinal: Negative for abdominal pain, blood in stool, constipation, diarrhea, heartburn and melena.  Genitourinary: Negative.   Skin: Negative.   Neurological: Negative for dizziness, sensory change, loss of consciousness and headaches.  Psychiatric/Behavioral: Negative for depression. The patient is not nervous/anxious and does not have insomnia.     Family history- Review and unchanged  Social history- Review and unchanged  Physical Exam: BP 128/82   Pulse 64   Temp 98 F (36.7 C) (Temporal)   Resp 18   Ht 5' (1.524  m)   Wt 126 lb (57.2 kg)   BMI 24.61 kg/m  Wt Readings from Last 3 Encounters:  07/26/16 126 lb (57.2 kg)  03/09/16 134 lb 12.8 oz (61.1 kg)  10/13/15 128 lb 9.6 oz (58.3 kg)    General Appearance: Well nourished well developed, in no apparent distress. Eyes: PERRLA, EOMs, conjunctiva no swelling or erythema ENT/Mouth: Ear canals normal without obstruction, swelling, erythma, discharge.  TMs normal bilaterally.  Oropharynx moist, clear, without exudate, or postoropharyngeal swelling. Neck: Supple, thyroid normal,no cervical adenopathy  Respiratory: Respiratory effort normal, Breath sounds clear A&P without rhonchi, wheeze, or rale.  No retractions, no accessory usage. Cardio: RRR with no MRGs. Brisk peripheral pulses without edema.  Abdomen: Soft, +  BS,  Non tender, no guarding, rebound, hernias, masses. Musculoskeletal: Patient rises slowly from sitting to standing.  They walk without an antalgic gait.  There is no evidence of erythema, ecchymosis, or gross deformity.  There is tenderness to palpation over right trapezius with no deformity or tenderness to the cervical bony spine.  Active ROM full but painful in the neck and shoulder.  Sensation to light touch is intact over all extremities.  Strength is symmetric and equal in all extremities. Full ROM, 5/5 strength, Normal gait Skin: Warm, dry without rashes, lesions, ecchymosis.  Neuro: Awake and oriented X 3, Cranial nerves intact. Normal muscle tone, no cerebellar symptoms. Psych: Normal affect, Insight and Judgment appropriate.    Terri Piedra, PA-C 4:36 PM [facility]

## 2016-07-27 ENCOUNTER — Other Ambulatory Visit: Payer: Self-pay | Admitting: Internal Medicine

## 2016-07-27 DIAGNOSIS — M542 Cervicalgia: Secondary | ICD-10-CM | POA: Diagnosis not present

## 2016-07-27 MED ORDER — DEXAMETHASONE SODIUM PHOSPHATE 10 MG/ML IJ SOLN
10.0000 mg | Freq: Once | INTRAMUSCULAR | Status: AC
  Administered 2016-07-27: 10 mg via INTRAMUSCULAR

## 2016-07-27 MED ORDER — GABAPENTIN 100 MG PO CAPS: ORAL_CAPSULE | ORAL | 0 refills | Status: DC

## 2016-07-27 NOTE — Progress Notes (Signed)
9:40 pm Patient is 1 day s/p trigger point injections in the neck with worsening sharp pains in the neck and under the scapular area not relieved with tylenol alternating with Motrin 400 mg q6h (per patient). Patient leaving town in am for a 5 day beach trip.   Recc Gabapentin 100 mg #90 - to take 1-2 caps 2-3 x / day   Discussed meds/SE's and recc if pain worsens to go to an ER/UC. [facility]

## 2016-08-03 ENCOUNTER — Other Ambulatory Visit: Payer: Self-pay | Admitting: Internal Medicine

## 2016-08-03 MED ORDER — CYCLOBENZAPRINE HCL 10 MG PO TABS: 10.0000 mg | ORAL_TABLET | Freq: Three times a day (TID) | ORAL | 2 refills | Status: DC | PRN

## 2016-08-10 ENCOUNTER — Encounter: Payer: Self-pay | Admitting: Internal Medicine

## 2016-08-11 ENCOUNTER — Other Ambulatory Visit: Payer: Self-pay | Admitting: Internal Medicine

## 2016-08-11 DIAGNOSIS — M542 Cervicalgia: Secondary | ICD-10-CM

## 2016-08-19 ENCOUNTER — Other Ambulatory Visit: Payer: Self-pay | Admitting: Physician Assistant

## 2016-08-19 DIAGNOSIS — E782 Mixed hyperlipidemia: Secondary | ICD-10-CM

## 2016-08-21 ENCOUNTER — Other Ambulatory Visit: Payer: Self-pay | Admitting: Internal Medicine

## 2016-08-21 ENCOUNTER — Other Ambulatory Visit: Payer: Self-pay | Admitting: Physician Assistant

## 2016-08-21 DIAGNOSIS — I10 Essential (primary) hypertension: Secondary | ICD-10-CM

## 2016-09-03 ENCOUNTER — Other Ambulatory Visit: Payer: Self-pay | Admitting: Physician Assistant

## 2016-09-07 ENCOUNTER — Other Ambulatory Visit: Payer: Self-pay | Admitting: Physician Assistant

## 2016-09-13 ENCOUNTER — Other Ambulatory Visit: Payer: Self-pay | Admitting: Physician Assistant

## 2016-09-13 DIAGNOSIS — R7303 Prediabetes: Secondary | ICD-10-CM

## 2016-09-19 ENCOUNTER — Other Ambulatory Visit: Payer: Self-pay | Admitting: Physician Assistant

## 2016-09-19 DIAGNOSIS — F419 Anxiety disorder, unspecified: Secondary | ICD-10-CM

## 2016-09-19 MED ORDER — ALPRAZOLAM 0.5 MG PO TABS: 0.5000 mg | ORAL_TABLET | Freq: Two times a day (BID) | ORAL | 1 refills | Status: DC | PRN

## 2016-09-19 NOTE — Progress Notes (Signed)
Xanax called into pharmacy on 22nd May 2018 @ 11:20am by DD

## 2016-09-26 ENCOUNTER — Other Ambulatory Visit: Payer: Self-pay | Admitting: Physician Assistant

## 2016-09-27 ENCOUNTER — Other Ambulatory Visit: Payer: Self-pay | Admitting: Physician Assistant

## 2016-09-27 DIAGNOSIS — R7303 Prediabetes: Secondary | ICD-10-CM

## 2016-10-15 ENCOUNTER — Other Ambulatory Visit: Payer: Self-pay | Admitting: Physician Assistant

## 2016-10-15 DIAGNOSIS — R7303 Prediabetes: Secondary | ICD-10-CM

## 2016-10-16 ENCOUNTER — Ambulatory Visit: Payer: BLUE CROSS/BLUE SHIELD | Admitting: Family Medicine

## 2016-10-18 ENCOUNTER — Other Ambulatory Visit: Payer: Self-pay | Admitting: Physician Assistant

## 2016-11-07 NOTE — Progress Notes (Deleted)
Assessment and Plan:   Essential hypertension - continue medications, DASH diet, exercise and monitor at home. Call if greater than 130/80.  -     BASIC METABOLIC PANEL WITH GFR -     CBC with Differential/Platelet -     Hepatic function panel -     TSH -     hydrochlorothiazide (HYDRODIURIL) 25 MG tablet; TAKE 1 TABLET (25 MG TOTAL) BY MOUTH DAILY.  Prediabetes Discussed general issues about diabetes pathophysiology and management., Educational material distributed., Suggested low cholesterol diet., Encouraged aerobic exercise., Discussed foot care., Reminded to get yearly retinal exam -     Hemoglobin A1c -     metFORMIN (GLUCOPHAGE-XR) 500 MG 24 hr tablet; TAKE 2 TABLETS BY MOUTH TWICE A DAY WITH FOOD FOR BLOOD SUGAR  Hyperlipidemia -continue medications, check lipids, decrease fatty foods, increase activity.  -     Lipid panel -     pantoprazole (PROTONIX) 40 MG tablet; TAKE 1 TABLET (40 MG TOTAL) BY MOUTH DAILY.  Medication management -     Magnesium  Tobacco use disorder Discussed stop smoking, continue chantix  Anxiety LONG discussion about abuse, will not take daily, continue zoloft -     ALPRAZolam (XANAX) 0.5 MG tablet; TAKE 1/2-1 TABLET BY MOUTH 2 TIMES DAILY FOR EXTREME ANXIETY. NOT A DAILY MEDICATION  Cough Getting better likely viral, if not improved will get CXR, if gets worse will send in ABX -     albuterol (PROVENTIL HFA;VENTOLIN HFA) 108 (90 Base) MCG/ACT inhaler; Inhale 2 puffs into the lungs every 6 (six) hours as needed for shortness of breath. -     promethazine-codeine (PHENERGAN WITH CODEINE) 6.25-10 MG/5ML syrup; Take 5 mLs by mouth every 6 (six) hours as needed for cough. Max: 20mL per day     Continue diet and meds as discussed. Further disposition pending results of labs.  HPI 46 y.o. female  presents for 3 month follow up with hypertension, hyperlipidemia, prediabetes and vitamin D. Her blood pressure has been controlled at home, today their BP  is   She does not workout. She denies chest pain, shortness of breath, dizziness.  She is on cholesterol medication, she is on 40mg  once daily and denies myalgias. Her cholesterol is not at goal. The cholesterol last visit was:   Lab Results  Component Value Date   CHOL 218 (H) 03/09/2016   HDL 47 (L) 03/09/2016   LDLCALC 104 (H) 03/09/2016   TRIG 335 (H) 03/09/2016   CHOLHDL 4.6 03/09/2016   She has been working on diet and exercise for prediabetes, and denies paresthesia of the feet, polydipsia, polyuria and visual disturbances. Last A1C in the office was:  Lab Results  Component Value Date   HGBA1C 5.6 03/09/2016   Patient is on Vitamin D supplement, can not take due to being sick.   Lab Results  Component Value Date   VD25OH 27 (L) 10/13/2015     BMI is There is no height or weight on file to calculate BMI., she is working on diet and exercise,she was on phentermine for weight loss, doing well but has been on the needed amount of time. She has gained 3 lbs but has been under increased stress, her house has been in foreclosure, had to move. Son;s finance about to have a baby, son has been having sobriety issues so she is watching grandson.  She is on zoloft, only taking as needed.  Wt Readings from Last 3 Encounters:  07/26/16 126 lb (  57.2 kg)  03/09/16 134 lb 12.8 oz (61.1 kg)  10/13/15 128 lb 9.6 oz (58.3 kg)  She has been smoking still due to stress but very very small amount, she states she will wait until stress gets better, she is doing better with being on chantix x 6 months. She has had sinus infection x 2 weeks, improving, only thing now is cough, no fever, chills, no SOB/wheezing.    Current Medications:  Current Outpatient Prescriptions on File Prior to Visit  Medication Sig Dispense Refill  . acyclovir (ZOVIRAX) 400 MG tablet TAKE 1 TABLET (400 MG TOTAL) BY MOUTH 3 (THREE) TIMES DAILY. 30 tablet 3  . albuterol (PROVENTIL HFA;VENTOLIN HFA) 108 (90 Base) MCG/ACT inhaler  Inhale 2 puffs into the lungs every 6 (six) hours as needed for shortness of breath. 18 g 1  . ALPRAZolam (XANAX) 0.5 MG tablet Take 1 tablet (0.5 mg total) by mouth 2 (two) times daily as needed for anxiety. Try not to take 2 x a daily, should last longer than a month 60 tablet 1  . atorvastatin (LIPITOR) 40 MG tablet TAKE 1 TABLET (40 MG TOTAL) BY MOUTH DAILY. 90 tablet 0  . cetirizine (ZYRTEC) 10 MG tablet Take 10 mg by mouth daily.    . CHANTIX 1 MG tablet TAKE 1 TABLET BY MOUTH TWICE A DAY 56 tablet 2  . cyclobenzaprine (FLEXERIL) 10 MG tablet TAKE 1 TABLET (10 MG TOTAL) BY MOUTH 3 (THREE) TIMES DAILY AS NEEDED FOR MUSCLE SPASMS. 30 tablet 1  . diphenhydrAMINE (BENADRYL) 25 MG tablet Take 25 mg by mouth every 6 (six) hours as needed. Takes 1 tablet daily PRN for allergies.    Marland Kitchen. gabapentin (NEURONTIN) 100 MG capsule Take 1 to 2 capsules 2 to 3 x / day if needed for pinched nerve pain in neck 90 capsule 0  . hydrochlorothiazide (HYDRODIURIL) 25 MG tablet TAKE 1 TABLET (25 MG TOTAL) BY MOUTH DAILY. 90 tablet 0  . metFORMIN (GLUCOPHAGE-XR) 500 MG 24 hr tablet TAKE 2 TABLETS BY MOUTH TWICE A DAY WITH FOOD FOR BLOOD SUGAR 120 tablet 0  . mometasone (NASONEX) 50 MCG/ACT nasal spray PLACE 2 SPRAYS INTO THE NOSE DAILY. 17 g 2  . nitroGLYCERIN (NITROSTAT) 0.3 MG SL tablet Place 1 tablet (0.3 mg total) under the tongue every 5 (five) minutes as needed for chest pain. 25 tablet 1  . nystatin cream (MYCOSTATIN) APPLY 1 APPLICATION TOPICALLY 2 (TWO) TIMES DAILY. 60 g 3  . pantoprazole (PROTONIX) 40 MG tablet TAKE 1 TABLET (40 MG TOTAL) BY MOUTH DAILY. 90 tablet 0  . sertraline (ZOLOFT) 100 MG tablet Take 200 mg by mouth daily.    Marland Kitchen. triamcinolone (NASACORT) 55 MCG/ACT nasal inhaler Place 2 sprays into the nose daily as needed. allergies    . zolpidem (AMBIEN) 10 MG tablet Take 10 mg by mouth at bedtime as needed. For sleep     No current facility-administered medications on file prior to visit.    Medical  History:  Past Medical History:  Diagnosis Date  . Anxiety   . Arthritis   . Hernia   . Hypertension   . Neuropathy (HCC)   . Postpartum depression   . Prediabetes   . Preeclampsia complicating hypertension   . Vitamin D deficiency    Allergies:  Allergies  Allergen Reactions  . Ace Inhibitors     Cough  . Doxycycline Nausea And Vomiting  . Penicillins     Review of Systems:  Review of  Systems  Constitutional: Negative.   HENT: Negative for congestion, ear discharge, ear pain, hearing loss, nosebleeds, sore throat and tinnitus.   Eyes: Negative.   Respiratory: Positive for cough. Negative for hemoptysis, sputum production, shortness of breath, wheezing and stridor.   Cardiovascular: Negative.   Gastrointestinal: Negative.   Genitourinary: Negative.   Musculoskeletal: Negative.   Skin: Negative.   Neurological: Negative for headaches.  Psychiatric/Behavioral: Negative for depression, hallucinations, memory loss, substance abuse and suicidal ideas. The patient is nervous/anxious. The patient does not have insomnia.      Family history- Review and unchanged Social history- Review and unchanged Physical Exam: There were no vitals taken for this visit. Wt Readings from Last 3 Encounters:  07/26/16 126 lb (57.2 kg)  03/09/16 134 lb 12.8 oz (61.1 kg)  10/13/15 128 lb 9.6 oz (58.3 kg)   General Appearance: Well nourished, in no apparent distress. Eyes: PERRLA, EOMs, conjunctiva no swelling or erythema Sinuses: No Frontal/maxillary tenderness ENT/Mouth: Ext aud canals clear, TMs without erythema, bulging. No erythema, swelling, or exudate on post pharynx.  Tonsils not swollen or erythematous. Hearing normal.  Neck: Supple, thyroid normal. + TMJ tenderness.  Respiratory: Respiratory effort normal, BS equal bilaterally without rales, rhonchi, wheezing or stridor.  Cardio: RRR with no MRGs. Brisk peripheral pulses without edema.  Abdomen: Soft, + BS.  Non tender, no guarding,  rebound, hernias, masses. Lymphatics: Non tender without lymphadenopathy.  Musculoskeletal: Full ROM, 5/5 strength, normal gait.  Skin: Warm, dry without rashes, lesions, ecchymosis.  Neuro: Cranial nerves intact. Normal muscle tone, no cerebellar symptoms. Sensation intact.  Psych: Awake and oriented X 3, normal affect, Insight and Judgment appropriate.    Quentin Mulling, PA-C 1:24 PM [facility]

## 2016-11-08 ENCOUNTER — Ambulatory Visit: Payer: Self-pay | Admitting: Physician Assistant

## 2016-11-13 ENCOUNTER — Other Ambulatory Visit: Payer: Self-pay | Admitting: Internal Medicine

## 2016-11-15 ENCOUNTER — Other Ambulatory Visit: Payer: Self-pay | Admitting: Internal Medicine

## 2016-11-15 ENCOUNTER — Other Ambulatory Visit: Payer: Self-pay | Admitting: Physician Assistant

## 2016-11-15 DIAGNOSIS — I10 Essential (primary) hypertension: Secondary | ICD-10-CM

## 2016-11-16 ENCOUNTER — Other Ambulatory Visit: Payer: Self-pay | Admitting: Internal Medicine

## 2016-11-16 DIAGNOSIS — R7303 Prediabetes: Secondary | ICD-10-CM

## 2016-11-20 ENCOUNTER — Other Ambulatory Visit: Payer: Self-pay | Admitting: Physician Assistant

## 2016-11-20 ENCOUNTER — Other Ambulatory Visit: Payer: Self-pay | Admitting: Internal Medicine

## 2016-11-20 DIAGNOSIS — F419 Anxiety disorder, unspecified: Secondary | ICD-10-CM

## 2016-11-21 NOTE — Telephone Encounter (Signed)
Xanax was called into pharmacy on 24th July 2018 at 8:36am by DD

## 2016-11-26 ENCOUNTER — Other Ambulatory Visit: Payer: Self-pay | Admitting: Physician Assistant

## 2016-12-16 ENCOUNTER — Other Ambulatory Visit: Payer: Self-pay | Admitting: Physician Assistant

## 2016-12-16 DIAGNOSIS — R7303 Prediabetes: Secondary | ICD-10-CM

## 2016-12-17 ENCOUNTER — Other Ambulatory Visit: Payer: Self-pay | Admitting: Internal Medicine

## 2016-12-17 DIAGNOSIS — E782 Mixed hyperlipidemia: Secondary | ICD-10-CM

## 2016-12-26 ENCOUNTER — Other Ambulatory Visit: Payer: Self-pay | Admitting: Physician Assistant

## 2016-12-26 DIAGNOSIS — F419 Anxiety disorder, unspecified: Secondary | ICD-10-CM

## 2016-12-27 ENCOUNTER — Other Ambulatory Visit: Payer: Self-pay | Admitting: Physician Assistant

## 2016-12-27 DIAGNOSIS — F419 Anxiety disorder, unspecified: Secondary | ICD-10-CM

## 2016-12-28 ENCOUNTER — Other Ambulatory Visit: Payer: Self-pay | Admitting: Physician Assistant

## 2016-12-28 DIAGNOSIS — F419 Anxiety disorder, unspecified: Secondary | ICD-10-CM

## 2016-12-28 NOTE — Telephone Encounter (Signed)
Xanax called into pharmacy on 30th Aug 2018 by DD 

## 2017-01-04 ENCOUNTER — Other Ambulatory Visit: Payer: Self-pay | Admitting: Physician Assistant

## 2017-01-05 NOTE — Progress Notes (Deleted)
Assessment and Plan:   Essential hypertension - continue medications, DASH diet, exercise and monitor at home. Call if greater than 130/80.  -     BASIC METABOLIC PANEL WITH GFR -     CBC with Differential/Platelet -     Hepatic function panel -     TSH -     hydrochlorothiazide (HYDRODIURIL) 25 MG tablet; TAKE 1 TABLET (25 MG TOTAL) BY MOUTH DAILY.  Prediabetes Discussed general issues about diabetes pathophysiology and management., Educational material distributed., Suggested low cholesterol diet., Encouraged aerobic exercise., Discussed foot care., Reminded to get yearly retinal exam -     Hemoglobin A1c -     metFORMIN (GLUCOPHAGE-XR) 500 MG 24 hr tablet; TAKE 2 TABLETS BY MOUTH TWICE A DAY WITH FOOD FOR BLOOD SUGAR  Hyperlipidemia -continue medications, check lipids, decrease fatty foods, increase activity.  -     Lipid panel -     pantoprazole (PROTONIX) 40 MG tablet; TAKE 1 TABLET (40 MG TOTAL) BY MOUTH DAILY.  Medication management -     Magnesium  Tobacco use disorder Discussed stop smoking, continue chantix  Anxiety LONG discussion about abuse, will not take daily, continue zoloft -     ALPRAZolam (XANAX) 0.5 MG tablet; TAKE 1/2-1 TABLET BY MOUTH 2 TIMES DAILY FOR EXTREME ANXIETY. NOT A DAILY MEDICATION   Continue diet and meds as discussed. Further disposition pending results of labs.  HPI 46 y.o. female  presents for 3 month follow up with hypertension, hyperlipidemia, prediabetes and vitamin D. Her blood pressure has been controlled at home, today their BP is   She does not workout. She denies chest pain, shortness of breath, dizziness.  She is on cholesterol medication, she is on  once daily and denies myalgias. Her cholesterol is not at goal. The cholesterol last visit was:   Lab Results  Component Value Date   CHOL 218 (H) 03/09/2016   HDL 47 (L) 03/09/2016   LDLCALC 104 (H) 03/09/2016   TRIG 335 (H) 03/09/2016   CHOLHDL 4.6 03/09/2016   She has been  working on diet and exercise for prediabetes, and denies paresthesia of the feet, polydipsia, polyuria and visual disturbances. Last A1C in the office was:  Lab Results  Component Value Date   HGBA1C 5.6 03/09/2016   Patient is on Vitamin D supplement, can not take due to being sick.   Lab Results  Component Value Date   VD25OH 27 (L) 10/13/2015     BMI is There is no height or weight on file to calculate BMI., she is working on diet and exercise,she was on phentermine for weight loss, doing well but has been on the needed amount of time. She has gained 3 lbs but has been under increased stress, her house has been in foreclosure, had to move. Son;s finance about to have a baby, son has been having sobriety issues so she is watching grandson.  She is on zoloft, only taking as needed.  Wt Readings from Last 3 Encounters:  07/26/16 126 lb (57.2 kg)  03/09/16 134 lb 12.8 oz (61.1 kg)  10/13/15 128 lb 9.6 oz (58.3 kg)  She has been smoking still due to stress but very very small amount, she states she will wait until stress gets better, she is doing better with being on chantix x 6 months. She has had sinus infection x 2 weeks, improving, only thing now is cough, no fever, chills, no SOB/wheezing.    Current Medications:  Current Outpatient Prescriptions  on File Prior to Visit  Medication Sig Dispense Refill  . acyclovir (ZOVIRAX) 400 MG tablet TAKE 1 TABLET (400 MG TOTAL) BY MOUTH 3 (THREE) TIMES DAILY. 30 tablet 3  . albuterol (PROVENTIL HFA;VENTOLIN HFA) 108 (90 Base) MCG/ACT inhaler Inhale 2 puffs into the lungs every 6 (six) hours as needed for shortness of breath. 18 g 1  . ALPRAZolam (XANAX) 0.5 MG tablet TAKE 1 TABLET BY MOUTH EVERY DAY AS NEEDED USE SPRAINGLY THIS MUST LAST 30 DAYS, 45 tablet 0  . atorvastatin (LIPITOR) 40 MG tablet TAKE 1 TABLET (40 MG TOTAL) BY MOUTH DAILY. 90 tablet 0  . cetirizine (ZYRTEC) 10 MG tablet Take 10 mg by mouth daily.    . CHANTIX 1 MG tablet TAKE 1  TABLET BY MOUTH TWICE A DAY 56 tablet 1  . cyclobenzaprine (FLEXERIL) 10 MG tablet TAKE 1 TABLET BY MOUTH THREE TIMES A DAY AS NEEDED FOR MUSCLE SPASMS 30 tablet 0  . diphenhydrAMINE (BENADRYL) 25 MG tablet Take 25 mg by mouth every 6 (six) hours as needed. Takes 1 tablet daily PRN for allergies.    Marland Kitchen gabapentin (NEURONTIN) 100 MG capsule Take 1 to 2 capsules 2 to 3 x / day if needed for pinched nerve pain in neck 90 capsule 0  . hydrochlorothiazide (HYDRODIURIL) 25 MG tablet TAKE 1 TABLET (25 MG TOTAL) BY MOUTH DAILY. 90 tablet 0  . metFORMIN (GLUCOPHAGE-XR) 500 MG 24 hr tablet TAKE 2 TABLETS BY MOUTH TWICE A DAY WITH FOOD FOR BLOOD SUGAR 120 tablet 0  . mometasone (NASONEX) 50 MCG/ACT nasal spray PLACE 2 SPRAYS INTO THE NOSE DAILY. 17 g 2  . nitroGLYCERIN (NITROSTAT) 0.3 MG SL tablet Place 1 tablet (0.3 mg total) under the tongue every 5 (five) minutes as needed for chest pain. 25 tablet 1  . nystatin cream (MYCOSTATIN) APPLY 1 APPLICATION TOPICALLY 2 (TWO) TIMES DAILY. 60 g 3  . pantoprazole (PROTONIX) 40 MG tablet TAKE 1 TABLET (40 MG TOTAL) BY MOUTH DAILY. 90 tablet 0  . sertraline (ZOLOFT) 100 MG tablet Take 200 mg by mouth daily.    Marland Kitchen triamcinolone (NASACORT) 55 MCG/ACT nasal inhaler Place 2 sprays into the nose daily as needed. allergies    . zolpidem (AMBIEN) 10 MG tablet Take 10 mg by mouth at bedtime as needed. For sleep     No current facility-administered medications on file prior to visit.    Medical History:  Past Medical History:  Diagnosis Date  . Anxiety   . Arthritis   . Hernia   . Hypertension   . Neuropathy (HCC)   . Postpartum depression   . Prediabetes   . Preeclampsia complicating hypertension   . Vitamin D deficiency    Allergies:  Allergies  Allergen Reactions  . Ace Inhibitors     Cough  . Doxycycline Nausea And Vomiting  . Penicillins     Review of Systems:  Review of Systems  Constitutional: Negative.   HENT: Negative for congestion, ear discharge,  ear pain, hearing loss, nosebleeds, sore throat and tinnitus.   Eyes: Negative.   Respiratory: Negative for cough, hemoptysis, sputum production, shortness of breath, wheezing and stridor.   Cardiovascular: Negative.   Gastrointestinal: Negative.   Genitourinary: Negative.   Musculoskeletal: Negative.   Skin: Negative.   Neurological: Negative for headaches.  Psychiatric/Behavioral: Negative for depression, hallucinations, memory loss, substance abuse and suicidal ideas. The patient is not nervous/anxious and does not have insomnia.      Family history- Review  and unchanged Social history- Review and unchanged Physical Exam: There were no vitals taken for this visit. Wt Readings from Last 3 Encounters:  07/26/16 126 lb (57.2 kg)  03/09/16 134 lb 12.8 oz (61.1 kg)  10/13/15 128 lb 9.6 oz (58.3 kg)   General Appearance: Well nourished, in no apparent distress. Eyes: PERRLA, EOMs, conjunctiva no swelling or erythema Sinuses: No Frontal/maxillary tenderness ENT/Mouth: Ext aud canals clear, TMs without erythema, bulging. No erythema, swelling, or exudate on post pharynx.  Tonsils not swollen or erythematous. Hearing normal.  Neck: Supple, thyroid normal. + TMJ tenderness.  Respiratory: Respiratory effort normal, BS equal bilaterally without rales, rhonchi, wheezing or stridor.  Cardio: RRR with no MRGs. Brisk peripheral pulses without edema.  Abdomen: Soft, + BS.  Non tender, no guarding, rebound, hernias, masses. Lymphatics: Non tender without lymphadenopathy.  Musculoskeletal: Full ROM, 5/5 strength, normal gait.  Skin: Warm, dry without rashes, lesions, ecchymosis.  Neuro: Cranial nerves intact. Normal muscle tone, no cerebellar symptoms. Sensation intact.  Psych: Awake and oriented X 3, normal affect, Insight and Judgment appropriate.    Quentin MullingAmanda Roosvelt Churchwell, PA-C 1:02 PM [facility]

## 2017-01-08 ENCOUNTER — Ambulatory Visit: Payer: Self-pay | Admitting: Physician Assistant

## 2017-01-12 NOTE — Progress Notes (Deleted)
Assessment and Plan:   Essential hypertension - continue medications, DASH diet, exercise and monitor at home. Call if greater than 130/80.  -     BASIC METABOLIC PANEL WITH GFR -     CBC with Differential/Platelet -     Hepatic function panel -     TSH -     hydrochlorothiazide (HYDRODIURIL) 25 MG tablet; TAKE 1 TABLET (25 MG TOTAL) BY MOUTH DAILY.  Prediabetes Discussed general issues about diabetes pathophysiology and management., Educational material distributed., Suggested low cholesterol diet., Encouraged aerobic exercise., Discussed foot care., Reminded to get yearly retinal exam -     Hemoglobin A1c -     metFORMIN (GLUCOPHAGE-XR) 500 MG 24 hr tablet; TAKE 2 TABLETS BY MOUTH TWICE A DAY WITH FOOD FOR BLOOD SUGAR  Hyperlipidemia -continue medications, check lipids, decrease fatty foods, increase activity.  -     Lipid panel -     pantoprazole (PROTONIX) 40 MG tablet; TAKE 1 TABLET (40 MG TOTAL) BY MOUTH DAILY.  Medication management -     Magnesium  Tobacco use disorder Discussed stop smoking, continue chantix  Anxiety LONG discussion about abuse, will not take daily, continue zoloft -     ALPRAZolam (XANAX) 0.5 MG tablet; TAKE 1/2-1 TABLET BY MOUTH 2 TIMES DAILY FOR EXTREME ANXIETY. NOT A DAILY MEDICATION   Continue diet and meds as discussed. Further disposition pending results of labs.  HPI 46 y.o. female  presents for 3 month follow up with hypertension, hyperlipidemia, prediabetes and vitamin D. Her blood pressure has been controlled at home, today their BP is   She does not workout. She denies chest pain, shortness of breath, dizziness.  She is on cholesterol medication, she is on  once daily and denies myalgias. Her cholesterol is not at goal. The cholesterol last visit was:   Lab Results  Component Value Date   CHOL 218 (H) 03/09/2016   HDL 47 (L) 03/09/2016   LDLCALC 104 (H) 03/09/2016   TRIG 335 (H) 03/09/2016   CHOLHDL 4.6 03/09/2016   She has been  working on diet and exercise for prediabetes, and denies paresthesia of the feet, polydipsia, polyuria and visual disturbances. Last A1C in the office was:  Lab Results  Component Value Date   HGBA1C 5.6 03/09/2016   Patient is on Vitamin D supplement, can not take due to being sick.   Lab Results  Component Value Date   VD25OH 27 (L) 10/13/2015     BMI is There is no height or weight on file to calculate BMI., she is working on diet and exercise,she was on phentermine for weight loss, doing well but has been on the needed amount of time. She has gained 3 lbs but has been under increased stress, her house has been in foreclosure, had to move. Son;s finance about to have a baby, son has been having sobriety issues so she is watching grandson.  She is on zoloft, only taking as needed.  Wt Readings from Last 3 Encounters:  07/26/16 126 lb (57.2 kg)  03/09/16 134 lb 12.8 oz (61.1 kg)  10/13/15 128 lb 9.6 oz (58.3 kg)  She has been smoking still due to stress but very very small amount, she states she will wait until stress gets better, she is doing better with being on chantix x 6 months. She has had sinus infection x 2 weeks, improving, only thing now is cough, no fever, chills, no SOB/wheezing.    Current Medications:  Current Outpatient Prescriptions  on File Prior to Visit  Medication Sig Dispense Refill  . acyclovir (ZOVIRAX) 400 MG tablet TAKE 1 TABLET (400 MG TOTAL) BY MOUTH 3 (THREE) TIMES DAILY. 30 tablet 3  . albuterol (PROVENTIL HFA;VENTOLIN HFA) 108 (90 Base) MCG/ACT inhaler Inhale 2 puffs into the lungs every 6 (six) hours as needed for shortness of breath. 18 g 1  . ALPRAZolam (XANAX) 0.5 MG tablet TAKE 1 TABLET BY MOUTH EVERY DAY AS NEEDED USE SPRAINGLY THIS MUST LAST 30 DAYS, 45 tablet 0  . atorvastatin (LIPITOR) 40 MG tablet TAKE 1 TABLET (40 MG TOTAL) BY MOUTH DAILY. 90 tablet 0  . cetirizine (ZYRTEC) 10 MG tablet Take 10 mg by mouth daily.    . CHANTIX 1 MG tablet TAKE 1  TABLET BY MOUTH TWICE A DAY 56 tablet 1  . cyclobenzaprine (FLEXERIL) 10 MG tablet TAKE 1 TABLET BY MOUTH THREE TIMES A DAY AS NEEDED FOR MUSCLE SPASMS 30 tablet 0  . diphenhydrAMINE (BENADRYL) 25 MG tablet Take 25 mg by mouth every 6 (six) hours as needed. Takes 1 tablet daily PRN for allergies.    Marland Kitchen gabapentin (NEURONTIN) 100 MG capsule Take 1 to 2 capsules 2 to 3 x / day if needed for pinched nerve pain in neck 90 capsule 0  . hydrochlorothiazide (HYDRODIURIL) 25 MG tablet TAKE 1 TABLET (25 MG TOTAL) BY MOUTH DAILY. 90 tablet 0  . metFORMIN (GLUCOPHAGE-XR) 500 MG 24 hr tablet TAKE 2 TABLETS BY MOUTH TWICE A DAY WITH FOOD FOR BLOOD SUGAR 120 tablet 0  . mometasone (NASONEX) 50 MCG/ACT nasal spray PLACE 2 SPRAYS INTO THE NOSE DAILY. 17 g 2  . nitroGLYCERIN (NITROSTAT) 0.3 MG SL tablet Place 1 tablet (0.3 mg total) under the tongue every 5 (five) minutes as needed for chest pain. 25 tablet 1  . nystatin cream (MYCOSTATIN) APPLY 1 APPLICATION TOPICALLY 2 (TWO) TIMES DAILY. 60 g 3  . pantoprazole (PROTONIX) 40 MG tablet TAKE 1 TABLET (40 MG TOTAL) BY MOUTH DAILY. 90 tablet 0  . sertraline (ZOLOFT) 100 MG tablet Take 200 mg by mouth daily.    Marland Kitchen triamcinolone (NASACORT) 55 MCG/ACT nasal inhaler Place 2 sprays into the nose daily as needed. allergies    . zolpidem (AMBIEN) 10 MG tablet Take 10 mg by mouth at bedtime as needed. For sleep     No current facility-administered medications on file prior to visit.    Medical History:  Past Medical History:  Diagnosis Date  . Anxiety   . Arthritis   . Hernia   . Hypertension   . Neuropathy (HCC)   . Postpartum depression   . Prediabetes   . Preeclampsia complicating hypertension   . Vitamin D deficiency    Allergies:  Allergies  Allergen Reactions  . Ace Inhibitors     Cough  . Doxycycline Nausea And Vomiting  . Penicillins     Review of Systems:  Review of Systems  Constitutional: Negative.   HENT: Negative for congestion, ear discharge,  ear pain, hearing loss, nosebleeds, sore throat and tinnitus.   Eyes: Negative.   Respiratory: Negative for cough, hemoptysis, sputum production, shortness of breath, wheezing and stridor.   Cardiovascular: Negative.   Gastrointestinal: Negative.   Genitourinary: Negative.   Musculoskeletal: Negative.   Skin: Negative.   Neurological: Negative for headaches.  Psychiatric/Behavioral: Negative for depression, hallucinations, memory loss, substance abuse and suicidal ideas. The patient is not nervous/anxious and does not have insomnia.      Family history- Review  and unchanged Social history- Review and unchanged Physical Exam: There were no vitals taken for this visit. Wt Readings from Last 3 Encounters:  07/26/16 126 lb (57.2 kg)  03/09/16 134 lb 12.8 oz (61.1 kg)  10/13/15 128 lb 9.6 oz (58.3 kg)   General Appearance: Well nourished, in no apparent distress. Eyes: PERRLA, EOMs, conjunctiva no swelling or erythema Sinuses: No Frontal/maxillary tenderness ENT/Mouth: Ext aud canals clear, TMs without erythema, bulging. No erythema, swelling, or exudate on post pharynx.  Tonsils not swollen or erythematous. Hearing normal.  Neck: Supple, thyroid normal. + TMJ tenderness.  Respiratory: Respiratory effort normal, BS equal bilaterally without rales, rhonchi, wheezing or stridor.  Cardio: RRR with no MRGs. Brisk peripheral pulses without edema.  Abdomen: Soft, + BS.  Non tender, no guarding, rebound, hernias, masses. Lymphatics: Non tender without lymphadenopathy.  Musculoskeletal: Full ROM, 5/5 strength, normal gait.  Skin: Warm, dry without rashes, lesions, ecchymosis.  Neuro: Cranial nerves intact. Normal muscle tone, no cerebellar symptoms. Sensation intact.  Psych: Awake and oriented X 3, normal affect, Insight and Judgment appropriate.    Quentin Mulling, PA-C 7:37 AM [facility]

## 2017-01-13 ENCOUNTER — Other Ambulatory Visit: Payer: Self-pay | Admitting: Internal Medicine

## 2017-01-14 ENCOUNTER — Other Ambulatory Visit: Payer: Self-pay | Admitting: Physician Assistant

## 2017-01-14 DIAGNOSIS — R7303 Prediabetes: Secondary | ICD-10-CM

## 2017-01-15 ENCOUNTER — Ambulatory Visit: Payer: Self-pay | Admitting: Physician Assistant

## 2017-01-16 ENCOUNTER — Other Ambulatory Visit: Payer: Self-pay | Admitting: Physician Assistant

## 2017-01-16 ENCOUNTER — Other Ambulatory Visit: Payer: Self-pay | Admitting: Internal Medicine

## 2017-01-23 ENCOUNTER — Other Ambulatory Visit: Payer: Self-pay | Admitting: Physician Assistant

## 2017-01-23 MED ORDER — VARENICLINE TARTRATE 1 MG PO TABS
1.0000 mg | ORAL_TABLET | Freq: Two times a day (BID) | ORAL | 0 refills | Status: DC
Start: 2017-01-23 — End: 2017-02-15

## 2017-01-23 MED ORDER — CYCLOBENZAPRINE HCL 10 MG PO TABS: ORAL_TABLET | ORAL | 0 refills | Status: DC

## 2017-01-24 ENCOUNTER — Other Ambulatory Visit: Payer: Self-pay | Admitting: Physician Assistant

## 2017-01-24 DIAGNOSIS — F419 Anxiety disorder, unspecified: Secondary | ICD-10-CM

## 2017-01-27 ENCOUNTER — Other Ambulatory Visit: Payer: Self-pay | Admitting: Physician Assistant

## 2017-01-27 DIAGNOSIS — F419 Anxiety disorder, unspecified: Secondary | ICD-10-CM

## 2017-01-28 ENCOUNTER — Other Ambulatory Visit: Payer: Self-pay | Admitting: Physician Assistant

## 2017-01-28 DIAGNOSIS — F419 Anxiety disorder, unspecified: Secondary | ICD-10-CM

## 2017-01-29 NOTE — Telephone Encounter (Signed)
Xanax was called into pharmacy on Oct 1st 2018 by DD 

## 2017-02-05 ENCOUNTER — Other Ambulatory Visit: Payer: Self-pay | Admitting: Physician Assistant

## 2017-02-15 ENCOUNTER — Encounter: Payer: Self-pay | Admitting: Internal Medicine

## 2017-02-15 ENCOUNTER — Other Ambulatory Visit: Payer: Self-pay | Admitting: Internal Medicine

## 2017-02-15 ENCOUNTER — Other Ambulatory Visit: Payer: Self-pay | Admitting: Physician Assistant

## 2017-02-15 DIAGNOSIS — R7303 Prediabetes: Secondary | ICD-10-CM

## 2017-02-15 DIAGNOSIS — I10 Essential (primary) hypertension: Secondary | ICD-10-CM

## 2017-02-19 ENCOUNTER — Other Ambulatory Visit: Payer: Self-pay | Admitting: Physician Assistant

## 2017-02-21 ENCOUNTER — Other Ambulatory Visit: Payer: Self-pay | Admitting: Physician Assistant

## 2017-02-26 ENCOUNTER — Other Ambulatory Visit: Payer: Self-pay | Admitting: Physician Assistant

## 2017-02-26 DIAGNOSIS — F419 Anxiety disorder, unspecified: Secondary | ICD-10-CM

## 2017-03-15 ENCOUNTER — Other Ambulatory Visit: Payer: Self-pay | Admitting: Physician Assistant

## 2017-03-15 DIAGNOSIS — R7303 Prediabetes: Secondary | ICD-10-CM

## 2017-03-15 DIAGNOSIS — I10 Essential (primary) hypertension: Secondary | ICD-10-CM

## 2017-03-19 ENCOUNTER — Other Ambulatory Visit: Payer: Self-pay | Admitting: Physician Assistant

## 2017-03-19 DIAGNOSIS — R7303 Prediabetes: Secondary | ICD-10-CM

## 2017-03-19 DIAGNOSIS — I10 Essential (primary) hypertension: Secondary | ICD-10-CM

## 2017-03-21 ENCOUNTER — Other Ambulatory Visit: Payer: Self-pay | Admitting: Physician Assistant

## 2017-05-30 ENCOUNTER — Other Ambulatory Visit: Payer: Self-pay | Admitting: Physician Assistant

## 2017-05-30 DIAGNOSIS — F419 Anxiety disorder, unspecified: Secondary | ICD-10-CM

## 2017-07-12 ENCOUNTER — Other Ambulatory Visit: Payer: Self-pay | Admitting: Physician Assistant

## 2018-09-29 ENCOUNTER — Encounter (HOSPITAL_COMMUNITY): Payer: Self-pay

## 2018-09-29 ENCOUNTER — Other Ambulatory Visit: Payer: Self-pay

## 2018-09-29 ENCOUNTER — Emergency Department (HOSPITAL_COMMUNITY)
Admission: EM | Admit: 2018-09-29 | Discharge: 2018-09-29 | Disposition: A | Discharge status: Home / Self Care | Payer: BLUE CROSS/BLUE SHIELD | Attending: Emergency Medicine | Admitting: Emergency Medicine

## 2018-09-29 DIAGNOSIS — E876 Hypokalemia: Secondary | ICD-10-CM | POA: Diagnosis not present

## 2018-09-29 DIAGNOSIS — F17200 Nicotine dependence, unspecified, uncomplicated: Secondary | ICD-10-CM | POA: Diagnosis not present

## 2018-09-29 DIAGNOSIS — D649 Anemia, unspecified: Secondary | ICD-10-CM | POA: Insufficient documentation

## 2018-09-29 DIAGNOSIS — F29 Unspecified psychosis not due to a substance or known physiological condition: Secondary | ICD-10-CM | POA: Diagnosis present

## 2018-09-29 DIAGNOSIS — Z79899 Other long term (current) drug therapy: Secondary | ICD-10-CM | POA: Diagnosis not present

## 2018-09-29 DIAGNOSIS — I10 Essential (primary) hypertension: Secondary | ICD-10-CM | POA: Diagnosis not present

## 2018-09-29 LAB — COMPREHENSIVE METABOLIC PANEL
ALT: 18 U/L (ref 0–44)
AST: 26 U/L (ref 15–41)
Albumin: 4.2 g/dL (ref 3.5–5.0)
Alkaline Phosphatase: 57 U/L (ref 38–126)
Anion gap: 14 (ref 5–15)
BUN: 18 mg/dL (ref 6–20)
CO2: 24 mmol/L (ref 22–32)
Calcium: 8.7 mg/dL — ABNORMAL LOW (ref 8.9–10.3)
Chloride: 98 mmol/L (ref 98–111)
Creatinine, Ser: 1.1 mg/dL — ABNORMAL HIGH (ref 0.44–1.00)
GFR calc Af Amer: >60 mL/min (ref >60)
GFR calc non Af Amer: 59 mL/min — ABNORMAL LOW (ref >60)
Glucose, Bld: 103 mg/dL — ABNORMAL HIGH (ref 70–99)
Potassium: 2.5 mmol/L — CL (ref 3.5–5.1)
Sodium: 136 mmol/L (ref 135–145)
Total Bilirubin: 0.4 mg/dL (ref 0.3–1.2)
Total Protein: 7.2 g/dL (ref 6.5–8.1)

## 2018-09-29 LAB — RAPID URINE DRUG SCREEN, HOSP PERFORMED
Amphetamines: NOT DETECTED
Barbiturates: NOT DETECTED
Benzodiazepines: POSITIVE — AB
Cocaine: NOT DETECTED
Opiates: NOT DETECTED
Tetrahydrocannabinol: NOT DETECTED

## 2018-09-29 LAB — CBC WITH DIFFERENTIAL/PLATELET
Abs Immature Granulocytes: 0.03 10*3/uL (ref 0.00–0.07)
Basophils Absolute: 0 10*3/uL (ref 0.0–0.1)
Basophils Relative: 0 %
Eosinophils Absolute: 0 10*3/uL (ref 0.0–0.5)
Eosinophils Relative: 0 %
HCT: 26.4 % — ABNORMAL LOW (ref 36.0–46.0)
Hemoglobin: 7.6 g/dL — ABNORMAL LOW (ref 12.0–15.0)
Immature Granulocytes: 0 %
Lymphocytes Relative: 16 %
Lymphs Abs: 1.6 10*3/uL (ref 0.7–4.0)
MCH: 18.4 pg — ABNORMAL LOW (ref 26.0–34.0)
MCHC: 28.8 g/dL — ABNORMAL LOW (ref 30.0–36.0)
MCV: 63.9 fL — ABNORMAL LOW (ref 80.0–100.0)
Monocytes Absolute: 0.7 10*3/uL (ref 0.1–1.0)
Monocytes Relative: 7 %
Neutro Abs: 7.5 10*3/uL (ref 1.7–7.7)
Neutrophils Relative %: 77 %
Platelets: 366 10*3/uL (ref 150–400)
RBC: 4.13 MIL/uL (ref 3.87–5.11)
RDW: 19.6 % — ABNORMAL HIGH (ref 11.5–15.5)
WBC: 9.8 10*3/uL (ref 4.0–10.5)
nRBC: 0 % (ref 0.0–0.2)

## 2018-09-29 LAB — I-STAT CHEM 8, ED
BUN: 13 mg/dL (ref 6–20)
Calcium, Ion: 1.06 mmol/L — ABNORMAL LOW (ref 1.15–1.40)
Chloride: 100 mmol/L (ref 98–111)
Creatinine, Ser: 0.8 mg/dL (ref 0.44–1.00)
Glucose, Bld: 106 mg/dL — ABNORMAL HIGH (ref 70–99)
HCT: 25 % — ABNORMAL LOW (ref 36.0–46.0)
Hemoglobin: 8.5 g/dL — ABNORMAL LOW (ref 12.0–15.0)
Potassium: 3.2 mmol/L — ABNORMAL LOW (ref 3.5–5.1)
Sodium: 135 mmol/L (ref 135–145)
TCO2: 23 mmol/L (ref 22–32)

## 2018-09-29 LAB — I-STAT BETA HCG BLOOD, ED (MC, WL, AP ONLY): I-stat hCG, quantitative: 5.2 m[IU]/mL — ABNORMAL HIGH (ref <5)

## 2018-09-29 LAB — CBG MONITORING, ED: Glucose-Capillary: 114 mg/dL — ABNORMAL HIGH (ref 70–99)

## 2018-09-29 LAB — ETHANOL: Alcohol, Ethyl (B): <10 mg/dL (ref <10)

## 2018-09-29 MED ORDER — POTASSIUM CHLORIDE 10 MEQ/100ML IV SOLN
10.0000 meq | Freq: Once | INTRAVENOUS | Status: AC
  Administered 2018-09-29: 10 meq via INTRAVENOUS
  Filled 2018-09-29: qty 100

## 2018-09-29 MED ORDER — POTASSIUM CHLORIDE CRYS ER 20 MEQ PO TBCR
60.0000 meq | EXTENDED_RELEASE_TABLET | Freq: Once | ORAL | Status: AC
  Administered 2018-09-29: 60 meq via ORAL
  Filled 2018-09-29: qty 3

## 2018-09-29 NOTE — ED Triage Notes (Signed)
Her husband phoned EMS d/t pt. "manic". She was initially seen by paramedics to be thrashing and flailing violently and attempting to kick people. She was given a total of 10mg  of Haldol I.M. and 5 mg of Versed I.M. en route to hospital. She arrives fairly calm and is cooperating thus far.

## 2018-09-29 NOTE — ED Notes (Signed)
She is quietly resting. She arouses easily for blood-draw and is pleasant and cooperative.

## 2018-09-29 NOTE — ED Provider Notes (Addendum)
[facility]-EMERGENCY DEPT Provider Note   CSN: [id number] Arrival date & time: 09/29/18  [id number]    History   Chief Complaint No chief complaint on file.   HPI Pamela Howe is a 48 y.o. female.     48 year old female who presents via EMS after becoming angry at home and having what sounds like a manic episode.  Patient states that she got an argument with her husband.  Has a history of similar symptoms.  Denies any SI or HI.  Patient was all combative that she required 10mg  of Haldol IM and 5 mg of Versed IM.  She feels much better at this time.  States that she has been weak for several months and has a history of anemia.  Denies any history of active blood loss.     Past Medical History:  Diagnosis Date  . Anxiety   . Arthritis   . Hernia   . Hypertension   . Neuropathy (HCC)   . Postpartum depression   . Prediabetes   . Preeclampsia complicating hypertension   . Vitamin D deficiency     Patient Active Problem List   Diagnosis Date Noted  . Esophageal reflux 10/13/2015  . Medication management 09/01/2013  . Hyperlipidemia 05/05/2013  . Hypertension   . Anxiety   . Prediabetes   . Vitamin D deficiency     Past Surgical History:  Procedure Laterality Date  . ELBOW SURGERY    . HERNIA REPAIR    . NERVE SURGERY     (R) hand  . uterine ablation       OB History    Gravida  6   Para  5   Term  5   Preterm      AB  1   Living  5     SAB  1   TAB      Ectopic      Multiple      Live Births               Home Medications    Prior to Admission medications   Medication Sig Start Date End Date Taking? Authorizing Provider  acyclovir (ZOVIRAX) 400 MG tablet TAKE 1 TABLET (400 MG TOTAL) BY MOUTH 3 (THREE) TIMES DAILY. 03/23/15   Quentin Mullingollier, Amanda, PA-C  albuterol (PROVENTIL HFA;VENTOLIN HFA) 108 (90 Base) MCG/ACT inhaler Inhale 2 puffs into the lungs every 6 (six) hours as needed for shortness of breath. 03/09/16    Quentin Mullingollier, Amanda, PA-C  ALPRAZolam Prudy Feeler(XANAX) 0.5 MG tablet TAKE 1 TO 1.5 TABLETS BY MOUTH EVERY DAY AS NEEDED MUST LAST 30 DAYS 01/29/17   Quentin Mullingollier, Amanda, PA-C  atorvastatin (LIPITOR) 40 MG tablet TAKE 1 TABLET BY MOUTH EVERY DAY 02/15/17   Quentin Mullingollier, Amanda, PA-C  cetirizine (ZYRTEC) 10 MG tablet Take 10 mg by mouth daily.    [provider]  CHANTIX 1 MG tablet TAKE 1 TABLET BY MOUTH TWICE A DAY 02/15/17   Quentin Mullingollier, Amanda, PA-C  cyclobenzaprine (FLEXERIL) 10 MG tablet TAKE 1 TABLET BY MOUTH THREE TIMES A DAY AS NEEDED FOR MUSCLE SPASMS 02/05/17   Quentin Mullingollier, Amanda, PA-C  diphenhydrAMINE (BENADRYL) 25 MG tablet Take 25 mg by mouth every 6 (six) hours as needed. Takes 1 tablet daily PRN for allergies.    [provider]  gabapentin (NEURONTIN) 100 MG capsule Take 1 to 2 capsules 2 to 3 x / day if needed for pinched nerve pain in neck 07/27/16 08/10/16  Lucky CowboyMcKeown, William,  MD  hydrochlorothiazide (HYDRODIURIL) 25 MG tablet TAKE 1 TABLET BY MOUTH EVERY DAY 02/15/17   Quentin Mulling, PA-C  metFORMIN (GLUCOPHAGE-XR) 500 MG 24 hr tablet TAKE 2 TABLETS BY MOUTH TWICE A DAY WITH FOOD FOR BLOOD SUGAR 02/15/17   Quentin Mulling, PA-C  mometasone (NASONEX) 50 MCG/ACT nasal spray PLACE 2 SPRAYS INTO THE NOSE DAILY. 09/13/15   Quentin Mulling, PA-C  nitroGLYCERIN (NITROSTAT) 0.3 MG SL tablet Place 1 tablet (0.3 mg total) under the tongue every 5 (five) minutes as needed for chest pain. 01/12/15 01/12/16  Quentin Mulling, PA-C  nystatin cream (MYCOSTATIN) APPLY 1 APPLICATION TOPICALLY 2 (TWO) TIMES DAILY. 07/25/15   Lucky Cowboy, MD  pantoprazole (PROTONIX) 40 MG tablet TAKE 1 TABLET (40 MG TOTAL) BY MOUTH DAILY. 12/17/16   Lucky Cowboy, MD  sertraline (ZOLOFT) 100 MG tablet Take 200 mg by mouth daily.    [provider]  triamcinolone (NASACORT) 55 MCG/ACT nasal inhaler Place 2 sprays into the nose daily as needed. allergies    [provider]  zolpidem (AMBIEN) 10 MG tablet Take 10  mg by mouth at bedtime as needed. For sleep    [provider]    Family History Family History  Problem Relation Age of Onset  . Cancer Mother   . Heart failure Father   . Hypertension Father   . Diabetes Father   . Heart disease Father   . Heart attack Father   . Hyperlipidemia Father   . Hypertension Mother   . Hyperlipidemia Mother     Social History Social History   Tobacco Use  . Smoking status: Current Every Day Smoker  . Smokeless tobacco: Never Used  . Tobacco comment: not smoking as much  Substance Use Topics  . Alcohol use: No  . Drug use: No     Allergies   Ace inhibitors; Doxycycline; and Penicillins   Review of Systems Review of Systems  All other systems reviewed and are negative.    Physical Exam Updated Vital Signs BP 110/64 (BP Location: Left Arm)   Pulse (!) 102   Temp 97.6 F (36.4 C) (Oral)   Resp 18   SpO2 99%   Physical Exam Vitals signs and nursing note reviewed.  Constitutional:      General: She is not in acute distress.    Appearance: Normal appearance. She is well-developed. She is not toxic-appearing.  HENT:     Head: Normocephalic and atraumatic.  Eyes:     General: Lids are normal.     Conjunctiva/sclera: Conjunctivae normal.     Pupils: Pupils are equal, round, and reactive to light.  Neck:     Musculoskeletal: Normal range of motion and neck supple.     Thyroid: No thyroid mass.     Trachea: No tracheal deviation.  Cardiovascular:     Rate and Rhythm: Normal rate and regular rhythm.     Heart sounds: Normal heart sounds. No murmur. No gallop.   Pulmonary:     Effort: Pulmonary effort is normal. No respiratory distress.     Breath sounds: Normal breath sounds. No stridor. No decreased breath sounds, wheezing, rhonchi or rales.  Abdominal:     General: Bowel sounds are normal. There is no distension.     Palpations: Abdomen is soft.     Tenderness: There is no abdominal tenderness. There is no rebound.   Musculoskeletal: Normal range of motion.        General: No tenderness.  Skin:  General: Skin is warm and dry.     Findings: No abrasion or rash.  Neurological:     Mental Status: She is alert and oriented to person, place, and time.     GCS: GCS eye subscore is 4. GCS verbal subscore is 5. GCS motor subscore is 6.     Cranial Nerves: No cranial nerve deficit.     Sensory: No sensory deficit.  Psychiatric:        Attention and Perception: Attention normal.        Mood and Affect: Affect is labile.        Speech: Speech normal.        Behavior: Behavior is agitated.        Thought Content: Thought content does not include homicidal or suicidal plan.      ED Treatments / Results  Labs (all labs ordered are listed, but only abnormal results are displayed) Labs Reviewed - No data to display  EKG None  Radiology No results found.  Procedures Procedures (including critical care time)  Medications Ordered in ED Medications - No data to display   Initial Impression / Assessment and Plan / ED Course  I have reviewed the triage vital signs and the nursing notes.  Pertinent labs & imaging results that were available during my care of the patient were reviewed by me and considered in my medical decision making (see chart for details).        Patient has been calm and cooperative here.  States that she is currently being treated for anemia and patient's hemoglobin of 7.6 was noted today.  Patient states that she has been seen a GI doctor for this.  States that this is improved from prior.  She also was hypokalemic and was treated with oral as well as IV potassium.  Repeat potassium is 3.2 which is better than the prior value of 2.5.  Patient states she has a history of this as well and has potassium tablets at home.  She continues to deny SI or HI.  States that she feels better and would like to go home.  Attempted to contact her husband but without success. Appears stable for  discharge Final Clinical Impressions(s) / ED Diagnoses   Final diagnoses:  None    ED Discharge Orders    None       Lorre Nick, MD 09/29/18 1307    Lorre Nick, MD 09/29/18 1310

## 2018-09-29 NOTE — ED Notes (Signed)
Dr. Freida Busman is speaking with her as I write this.

## 2020-09-19 ENCOUNTER — Encounter (HOSPITAL_COMMUNITY): Payer: Self-pay

## 2020-09-19 ENCOUNTER — Emergency Department (HOSPITAL_COMMUNITY)
Admission: EM | Admit: 2020-09-19 | Discharge: 2020-09-19 | Disposition: A | Discharge status: Home / Self Care | Payer: BLUE CROSS/BLUE SHIELD | Attending: Emergency Medicine | Admitting: Emergency Medicine

## 2020-09-19 ENCOUNTER — Other Ambulatory Visit: Payer: Self-pay

## 2020-09-19 DIAGNOSIS — Z20822 Contact with and (suspected) exposure to covid-19: Secondary | ICD-10-CM | POA: Diagnosis not present

## 2020-09-19 DIAGNOSIS — Z046 Encounter for general psychiatric examination, requested by authority: Secondary | ICD-10-CM | POA: Diagnosis present

## 2020-09-19 DIAGNOSIS — F17200 Nicotine dependence, unspecified, uncomplicated: Secondary | ICD-10-CM | POA: Insufficient documentation

## 2020-09-19 DIAGNOSIS — R7309 Other abnormal glucose: Secondary | ICD-10-CM | POA: Insufficient documentation

## 2020-09-19 DIAGNOSIS — I10 Essential (primary) hypertension: Secondary | ICD-10-CM | POA: Insufficient documentation

## 2020-09-19 DIAGNOSIS — D649 Anemia, unspecified: Secondary | ICD-10-CM

## 2020-09-19 DIAGNOSIS — F411 Generalized anxiety disorder: Secondary | ICD-10-CM | POA: Insufficient documentation

## 2020-09-19 DIAGNOSIS — F99 Mental disorder, not otherwise specified: Secondary | ICD-10-CM

## 2020-09-19 DIAGNOSIS — Z79899 Other long term (current) drug therapy: Secondary | ICD-10-CM | POA: Diagnosis not present

## 2020-09-19 LAB — COMPREHENSIVE METABOLIC PANEL
ALT: 15 U/L (ref 0–44)
AST: 29 U/L (ref 15–41)
Albumin: 4.9 g/dL (ref 3.5–5.0)
Alkaline Phosphatase: 70 U/L (ref 38–126)
Anion gap: 16 — ABNORMAL HIGH (ref 5–15)
BUN: 10 mg/dL (ref 6–20)
CO2: 18 mmol/L — ABNORMAL LOW (ref 22–32)
Calcium: 9.3 mg/dL (ref 8.9–10.3)
Chloride: 101 mmol/L (ref 98–111)
Creatinine, Ser: 0.89 mg/dL (ref 0.44–1.00)
GFR, Estimated: >60 mL/min (ref >60)
Glucose, Bld: 201 mg/dL — ABNORMAL HIGH (ref 70–99)
Potassium: 3 mmol/L — ABNORMAL LOW (ref 3.5–5.1)
Sodium: 135 mmol/L (ref 135–145)
Total Bilirubin: 0.4 mg/dL (ref 0.3–1.2)
Total Protein: 8.6 g/dL — ABNORMAL HIGH (ref 6.5–8.1)

## 2020-09-19 LAB — RAPID URINE DRUG SCREEN, HOSP PERFORMED
Amphetamines: NOT DETECTED
Barbiturates: NOT DETECTED
Benzodiazepines: POSITIVE — AB
Cocaine: NOT DETECTED
Opiates: NOT DETECTED
Tetrahydrocannabinol: NOT DETECTED

## 2020-09-19 LAB — RESP PANEL BY RT-PCR (FLU A&B, COVID) ARPGX2
Influenza A by PCR: NEGATIVE
Influenza B by PCR: NEGATIVE
SARS Coronavirus 2 by RT PCR: NEGATIVE

## 2020-09-19 LAB — CBC
HCT: 28.2 % — ABNORMAL LOW (ref 36.0–46.0)
Hemoglobin: 7.5 g/dL — ABNORMAL LOW (ref 12.0–15.0)
MCH: 16.2 pg — ABNORMAL LOW (ref 26.0–34.0)
MCHC: 26.6 g/dL — ABNORMAL LOW (ref 30.0–36.0)
MCV: 60.8 fL — ABNORMAL LOW (ref 80.0–100.0)
Platelets: 553 10*3/uL — ABNORMAL HIGH (ref 150–400)
RBC: 4.64 MIL/uL (ref 3.87–5.11)
RDW: 22.5 % — ABNORMAL HIGH (ref 11.5–15.5)
WBC: 11.9 10*3/uL — ABNORMAL HIGH (ref 4.0–10.5)
nRBC: 0 % (ref 0.0–0.2)

## 2020-09-19 LAB — SALICYLATE LEVEL
Salicylate Lvl: 17.2 mg/dL (ref 7.0–30.0)
Salicylate Lvl: 12.9 mg/dL (ref 7.0–30.0)

## 2020-09-19 LAB — ACETAMINOPHEN LEVEL: Acetaminophen (Tylenol), Serum: <10 ug/mL — ABNORMAL LOW (ref 10–30)

## 2020-09-19 LAB — POC OCCULT BLOOD, ED: Fecal Occult Bld: NEGATIVE

## 2020-09-19 LAB — ETHANOL: Alcohol, Ethyl (B): <10 mg/dL (ref <10)

## 2020-09-19 MED ORDER — STERILE WATER FOR INJECTION IJ SOLN
INTRAMUSCULAR | Status: AC
  Administered 2020-09-19: 2.1 mL
  Filled 2020-09-19: qty 10

## 2020-09-19 MED ORDER — POTASSIUM CHLORIDE CRYS ER 20 MEQ PO TBCR
40.0000 meq | EXTENDED_RELEASE_TABLET | Freq: Once | ORAL | Status: AC
  Administered 2020-09-19: 40 meq via ORAL
  Filled 2020-09-19: qty 2

## 2020-09-19 MED ORDER — ZIPRASIDONE MESYLATE 20 MG IM SOLR
20.0000 mg | Freq: Once | INTRAMUSCULAR | Status: AC
  Administered 2020-09-19: 20 mg via INTRAMUSCULAR
  Filled 2020-09-19: qty 20
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
## 2020-09-19 NOTE — Discharge Instructions (Addendum)
Follow-up with a counselor for stress management, as soon as possible.

## 2020-09-19 NOTE — ED Provider Notes (Addendum)
12:15 PM-he has been seen and medically cleared by psychiatry.  Currently patient's husband, she is under a lot of stress.  She is not currently responding to internal stimuli.  She is cooperative & insightful.  I have encouraged her to follow-up with a psychiatric provider as an outpatient.  IVC reversed by me.   Mancel Bale, MD 09/19/20 1230    Mancel Bale, MD 09/19/20 1230  1:00 PM-I discussed the situation with the patient's son, Fredricka Bonine, his request.  I reached him by telephone, at [phone].  He related that he did not think the patient would follow-up for treatment, as has been recommended.  I explained to Fredricka Bonine that the patient was medically cleared, and her IVC was reversed after consultation with psychiatry.  He stated that he will come and pick the patient up.   Mancel Bale, MD 09/19/20 [phone]
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
## 2020-09-19 NOTE — ED Provider Notes (Addendum)
[facility]-EMERGENCY DEPT Provider Note   CSN: [id number] Arrival date & time: 09/19/20  0248     History Chief Complaint  Patient presents with  . IVC    Pamela Howe is a 50 y.o. female.  Patient brought in by police under IVC.  Husband states that patient started to act bizarrely and started destroying the house.  Appears paranoid for police.  Patient was accusing them of being drug dealers working with the [facility] and that she was being held Pharmacist, community.  Difficult to obtain any additional history as she appears paranoid and manic.        Past Medical History:  Diagnosis Date  . Anxiety   . Arthritis   . Hernia   . Hypertension   . Neuropathy   . Postpartum depression   . Prediabetes   . Preeclampsia complicating hypertension   . Vitamin D deficiency     Patient Active Problem List   Diagnosis Date Noted  . Esophageal reflux 10/13/2015  . Medication management 09/01/2013  . Hyperlipidemia 05/05/2013  . Hypertension   . Anxiety   . Prediabetes   . Vitamin D deficiency     Past Surgical History:  Procedure Laterality Date  . ELBOW SURGERY    . HERNIA REPAIR    . NERVE SURGERY     (R) hand  . uterine ablation       OB History    Gravida  6   Para  5   Term  5   Preterm      AB  1   Living  5     SAB  1   IAB      Ectopic      Multiple      Live Births              Family History  Problem Relation Age of Onset  . Cancer Mother   . Hypertension Mother   . Hyperlipidemia Mother   . Heart failure Father   . Hypertension Father   . Diabetes Father   . Heart disease Father   . Heart attack Father   . Hyperlipidemia Father     Social History   Tobacco Use  . Smoking status: Current Every Day Smoker  . Smokeless tobacco: Never Used  . Tobacco comment: not smoking as much  Substance Use Topics  . Alcohol use: No  . Drug use: No    Home Medications Prior to Admission medications   Medication Sig Start  Date End Date Taking? Authorizing Provider  acyclovir (ZOVIRAX) 400 MG tablet TAKE 1 TABLET (400 MG TOTAL) BY MOUTH 3 (THREE) TIMES DAILY. Patient not taking: Reported on 09/29/2018 03/23/15   Doree Albee, PA-C  albuterol (PROVENTIL HFA;VENTOLIN HFA) 108 (90 Base) MCG/ACT inhaler Inhale 2 puffs into the lungs every 6 (six) hours as needed for shortness of breath. 03/09/16   Doree Albee, PA-C  ALPRAZolam Prudy Feeler) 0.5 MG tablet TAKE 1 TO 1.5 TABLETS BY MOUTH EVERY DAY AS NEEDED MUST LAST 30 DAYS Patient not taking: Reported on 09/29/2018 01/29/17   Doree Albee, PA-C  ALPRAZolam Prudy Feeler) 1 MG tablet Take 0.5-1 mg by mouth 3 (three) times daily as needed for anxiety.    [provider]  Aspirin-Salicylamide-Caffeine (BC HEADACHE POWDER PO) Take 1 Package by mouth daily as needed (headache).    [provider]  atorvastatin (LIPITOR) 40 MG tablet TAKE 1 TABLET BY MOUTH EVERY DAY Patient taking  differently: Take 40 mg by mouth at bedtime.  02/15/17   Doree Albee, PA-C  cetirizine (ZYRTEC) 10 MG tablet Take 10 mg by mouth daily as needed for allergies.     [provider]  CHANTIX 1 MG tablet TAKE 1 TABLET BY MOUTH TWICE A DAY Patient not taking: Reported on 09/29/2018 02/15/17   Doree Albee, PA-C  cyclobenzaprine (FLEXERIL) 10 MG tablet TAKE 1 TABLET BY MOUTH THREE TIMES A DAY AS NEEDED FOR MUSCLE SPASMS Patient not taking: Reported on 09/29/2018 02/05/17   Doree Albee, PA-C  diphenhydrAMINE (BENADRYL) 25 MG tablet Take 50 mg by mouth every 6 (six) hours as needed for allergies.     [provider]  FeFum-FePoly-FA-B Cmp-C-Biot (FOLIVANE-PLUS) CAPS Take 1 capsule by mouth 2 (two) times a day.    [provider]  gabapentin (NEURONTIN) 100 MG capsule Take 1 to 2 capsules 2 to 3 x / day if needed for pinched nerve pain in neck Patient not taking: Reported on 09/29/2018 07/27/16 08/10/16  Lucky Cowboy, MD  hydrochlorothiazide  (HYDRODIURIL) 25 MG tablet TAKE 1 TABLET BY MOUTH EVERY DAY Patient taking differently: Take 25 mg by mouth daily.  02/15/17   Doree Albee, PA-C  metFORMIN (GLUCOPHAGE-XR) 500 MG 24 hr tablet TAKE 2 TABLETS BY MOUTH TWICE A DAY WITH FOOD FOR BLOOD SUGAR Patient taking differently: Take 1,000 mg by mouth 2 (two) times daily with a meal.  02/15/17   Quentin Mulling R, PA-C  mometasone (NASONEX) 50 MCG/ACT nasal spray PLACE 2 SPRAYS INTO THE NOSE DAILY. Patient taking differently: Place 2 sprays into the nose daily as needed (allergies).  09/13/15   Doree Albee, PA-C  nitroGLYCERIN (NITROSTAT) 0.3 MG SL tablet Place 1 tablet (0.3 mg total) under the tongue every 5 (five) minutes as needed for chest pain. 01/12/15 01/12/16  Doree Albee, PA-C  nystatin cream (MYCOSTATIN) APPLY 1 APPLICATION TOPICALLY 2 (TWO) TIMES DAILY. Patient not taking: Reported on 09/29/2018 07/25/15   Lucky Cowboy, MD  pantoprazole (PROTONIX) 40 MG tablet TAKE 1 TABLET (40 MG TOTAL) BY MOUTH DAILY. Patient taking differently: Take 40 mg by mouth daily.  12/17/16   Lucky Cowboy, MD  sertraline (ZOLOFT) 100 MG tablet Take 200 mg by mouth daily.    [provider]  zolpidem (AMBIEN) 10 MG tablet Take 10 mg by mouth at bedtime as needed for sleep.     [provider]    Allergies    Ace inhibitors, Doxycycline, Penicillins, and Sulfa antibiotics  Review of Systems   Review of Systems  Unable to perform ROS: Psychiatric disorder    Physical Exam Updated Vital Signs BP 124/75   Pulse 90   Temp 98.4 F (36.9 C) (Oral)   Resp 15   Ht 5' (1.524 m)   Wt 49.9 kg   SpO2 99%   BMI 21.48 kg/m   Physical Exam Constitutional:      Appearance: Normal appearance.     Comments: Appears agitated  HENT:     Head: Normocephalic.     Nose: Nose normal.  Eyes:     Extraocular Movements: Extraocular movements intact.  Cardiovascular:     Rate and Rhythm: Regular rhythm.  Pulmonary:      Effort: Pulmonary effort is normal.  Genitourinary:    Rectum: Guaiac result negative.     Comments: Rectal exam performed with nursing chaperone present.  Stool is normal.  Brown in color nonbloody. Musculoskeletal:  General: Normal range of motion.     Cervical back: Normal range of motion.  Neurological:     General: No focal deficit present.     Mental Status: She is alert. Mental status is at baseline.     ED Results / Procedures / Treatments   Labs (all labs ordered are listed, but only abnormal results are displayed) Labs Reviewed  COMPREHENSIVE METABOLIC PANEL - Abnormal; Notable for the following components:      Result Value   Potassium 3.0 (*)    CO2 18 (*)    Glucose, Bld 201 (*)    Total Protein 8.6 (*)    Anion gap 16 (*)    All other components within normal limits  ACETAMINOPHEN LEVEL - Abnormal; Notable for the following components:   Acetaminophen (Tylenol), Serum <10 (*)    All other components within normal limits  CBC - Abnormal; Notable for the following components:   WBC 11.9 (*)    Hemoglobin 7.5 (*)    HCT 28.2 (*)    MCV 60.8 (*)    MCH 16.2 (*)    MCHC 26.6 (*)    RDW 22.5 (*)    Platelets 553 (*)    All other components within normal limits  RESP PANEL BY RT-PCR (FLU A&B, COVID) ARPGX2  ETHANOL  SALICYLATE LEVEL  SALICYLATE LEVEL  RAPID URINE DRUG SCREEN, HOSP PERFORMED  POC OCCULT BLOOD, ED    EKG None  Radiology No results found.  Procedures Procedures   Medications Ordered in ED Medications  ziprasidone (GEODON) injection 20 mg (20 mg Intramuscular Given 09/19/20 0318)  sterile water (preservative free) injection (2.1 mLs  Given 09/19/20 0318)  potassium chloride SA (KLOR-CON) CR tablet 40 mEq (40 mEq Oral Given 09/19/20 0532)    ED Course  I have reviewed the triage vital signs and the nursing notes.  Pertinent labs & imaging results that were available during my care of the patient were reviewed by me and considered  in my medical decision making (see chart for details).    MDM Rules/Calculators/A&P                          Patient is agitated combative, paranoid appearing.  She required chemical sedation as well as physical restraints.  IVC has been placed prior to arrival to the ER.  Psychiatry has been consulted for evaluation.  Patient is anemic, has a history of chronic anemia apparently.  Rectal exam is otherwise benign with no active bleeding noted.  Vital signs remained stable.  Aspirin level mildly elevated, repeat level appears normal.  Patient medically cleared and pending crisis evaluation.  Final Clinical Impression(s) / ED Diagnoses Final diagnoses:  Psychiatric illness  Anemia, unspecified type    Rx / DC Orders ED Discharge Orders    None       Cheryll Cockayne, MD 09/19/20 1700    Cheryll Cockayne, MD 09/19/20 [phone]

## 2021-07-14 ENCOUNTER — Telehealth: Payer: Self-pay | Admitting: Hematology and Oncology

## 2021-07-14 NOTE — Telephone Encounter (Signed)
Scheduled appt per 3/9 referral. Pt is aware of appt date and time. Pt is aware to arrive 15 mins prior to appt time and to bring and updated insurance card. Pt is aware of appt location.   ?

## 2021-07-15 NOTE — Progress Notes (Signed)
[name]  Patient Care Team: Pcp, No as PCP - General Pamela Masson, MD as Consulting Physician (Cardiology)  ASSESSMENT & PLAN:  Iron deficiency anemia She has chronic, severe iron deficiency anemia likely due to prior history of menorrhagia and potentially GI blood loss I recommend the patient to refrain from taking any NSAID or aspirin containing products We will request prior endoscopy reports from her gastroenterologist She is profoundly symptomatic from severe anemia I recommend 2 units of blood transfusion followed by 5 doses of IV iron sucrose infusion with plan to see her back next month for further follow-up We discussed some of the risks, benefits, and alternatives of blood transfusions. The patient is symptomatic from anemia and the hemoglobin level is critically low.  Some of the side-effects to be expected including risks of transfusion reactions, chills, infection, syndrome of volume overload and risk of hospitalization from various reasons and the patient is willing to proceed and went ahead to sign consent today. The most likely cause of her anemia is due to chronic blood loss/malabsorption syndrome. We discussed some of the risks, benefits, and alternatives of intravenous iron infusions. The patient is symptomatic from anemia and the iron level is critically low. She tolerated oral iron supplement poorly and desires to achieved higher levels of iron faster for adequate hematopoesis. Some of the side-effects to be expected including risks of infusion reactions, phlebitis, headaches, nausea and fatigue.  The patient is willing to proceed. Patient education material was dispensed.  Goal is to keep ferritin level greater than 50 and resolution of anemia   Esophageal reflux She is known to have history of gastric reflux I recommend the patient to refrain from taking any NSAID or aspirin containing products  Orders Placed This Encounter   Procedures   Ferritin    Standing Status:   Standing    Number of Occurrences:   3    Standing Expiration Date:   07/19/2022   Iron and Iron Binding Capacity ([facility] only)    Standing Status:   Standing    Number of Occurrences:   3    Standing Expiration Date:   07/19/2022   Reticulocytes    Standing Status:   Future    Number of Occurrences:   1    Standing Expiration Date:   07/19/2022   Vitamin B12    Standing Status:   Future    Number of Occurrences:   1    Standing Expiration Date:   07/19/2022   CBC with Differential (Cancer Center Only)    Standing Status:   Standing    Number of Occurrences:   9    Standing Expiration Date:   07/19/2022   Sedimentation rate    Standing Status:   Future    Number of Occurrences:   1    Standing Expiration Date:   07/19/2022   TSH    Standing Status:   Future    Number of Occurrences:   1    Standing Expiration Date:   07/19/2022   Care order/instruction    Transfuse Parameters    Standing Status:   Future    Number of Occurrences:   1    Standing Expiration Date:   07/18/2022   Informed Consent Details: Physician/Practitioner Attestation; Transcribe to consent form and obtain patient signature    Standing Status:   Future    Number of Occurrences:   1    Standing Expiration Date:  07/19/2022    Order Specific Question:   Physician/Practitioner attestation of informed consent for blood and or blood product transfusion    Answer:   I, the physician/practitioner, attest that I have discussed with the patient the benefits, risks, side effects, alternatives, likelihood of achieving goals and potential problems during recovery for the procedure that I have provided informed consent.    Order Specific Question:   Product(s)    Answer:   All Product(s)   Sample to Blood Bank    Standing Status:   Standing    Number of Occurrences:   33    Standing Expiration Date:   07/19/2022   Type and screen         Standing Status:   Future    Number  of Occurrences:   1    Standing Expiration Date:   07/19/2022   Prepare RBC (crossmatch)    Standing Status:   Standing    Number of Occurrences:   1    Order Specific Question:   # of Units    Answer:   2 units    Order Specific Question:   Transfusion Indications    Answer:   Symptomatic Anemia    Order Specific Question:   Number of Units to Keep Ahead    Answer:   NO units ahead    Order Specific Question:   Instructions:    Answer:   Transfuse    Order Specific Question:   If emergent release call blood bank    Answer:   Not emergent release    All questions were answered. The patient knows to call the clinic with any problems, questions or concerns.  The total time spent in the appointment was 60 minutes encounter with patients including review of chart and various tests results, discussions about plan of care and coordination of care plan  Artis Delay, MD 3/21/20238:33 AM   CHIEF COMPLAINTS/PURPOSE OF CONSULTATION:  Anemia  HISTORY OF PRESENTING ILLNESS:  Pamela Howe 51 y.o. female is here because of anemia She is known to have anemia in 2022 Most recently, on 07/05/2021, she had CBC checked which showed severe anemia White blood cell count 6.2, hemoglobin 5.9, MCV 53.6 and platelet count 299  She was found to have abnormal CBC from routine blood work monitoring The patient is known to be anemic for many years However, due to her anxiety, she does not seek help or medical attention for a while Prior to COVID/pandemic lockdown, she had EGD and colonoscopy reviewed by Dr. Dulce Sellar and was told it was normal She had 6 pregnancies and has been told she was anemic during pregnancies She have history of menorrhagia status post uterine ablation and she have no further menstrual cycle since then She had recent chest pain on exertion, shortness of breath on minimal exertion, pre-syncopal episodes, profound fatigue, leg cramps, restless leg and palpitations. She had not noticed any  recent bleeding such as epistaxis, hematuria or hematochezia She has taken occasional NSAID Her last colonoscopy was approximately 3 years ago She had no prior history or diagnosis of cancer. Her age appropriate screening programs are up-to-date. She has excessive pica with ice craving; she eats a variety of diet. She never donated blood or received blood transfusion The patient was prescribed oral iron supplements and she takes 2 tablets daily and that caused severe constipation and stomach upset  MEDICAL HISTORY:  Past Medical History:  Diagnosis Date   Anxiety    Arthritis  Hernia    Hypertension    Neuropathy    Postpartum depression    Prediabetes    Preeclampsia complicating hypertension    Vitamin D deficiency     SURGICAL HISTORY: Past Surgical History:  Procedure Laterality Date   ELBOW SURGERY     HERNIA REPAIR     NERVE SURGERY     (R) hand   uterine ablation      SOCIAL HISTORY: Social History   Socioeconomic History   Marital status: Married    Spouse name: Not on file   Number of children: Not on file   Years of education: Not on file   Highest education level: Not on file  Occupational History   Not on file  Tobacco Use   Smoking status: Every Day   Smokeless tobacco: Never   Tobacco comments:    not smoking as much  Substance and Sexual Activity   Alcohol use: No   Drug use: No   Sexual activity: Yes    Birth control/protection: Other-see comments    Comment: spouse had a vasectomy  Other Topics Concern   Not on file  Social History Narrative   Not on file   Social Determinants of Health   Financial Resource Strain: Not on file  Food Insecurity: Not on file  Transportation Needs: Not on file  Physical Activity: Not on file  Stress: Not on file  Social Connections: Not on file  Intimate Partner Violence: Not on file    FAMILY HISTORY: Family History  Problem Relation Age of Onset   Cancer Mother    Hypertension Mother     Hyperlipidemia Mother    Heart failure Father    Hypertension Father    Diabetes Father    Heart disease Father    Heart attack Father    Hyperlipidemia Father     ALLERGIES:  is allergic to ace inhibitors, doxycycline, penicillins, and sulfa antibiotics.  MEDICATIONS:  Current Outpatient Medications  Medication Sig Dispense Refill   ALPRAZolam (XANAX) 0.5 MG tablet TAKE 1 TO 1.5 TABLETS BY MOUTH EVERY DAY AS NEEDED MUST LAST 30 DAYS (Patient not taking: Reported on 09/29/2018) 45 tablet 0   Aspirin-Salicylamide-Caffeine (BC HEADACHE POWDER PO) Take 1 Package by mouth daily as needed (headache).     atorvastatin (LIPITOR) 40 MG tablet TAKE 1 TABLET BY MOUTH EVERY DAY (Patient taking differently: Take 40 mg by mouth at bedtime. ) 30 tablet 0   cetirizine (ZYRTEC) 10 MG tablet Take 10 mg by mouth daily as needed for allergies.      diphenhydrAMINE (BENADRYL) 25 MG tablet Take 50 mg by mouth every 6 (six) hours as needed for allergies.      hydrochlorothiazide (HYDRODIURIL) 25 MG tablet TAKE 1 TABLET BY MOUTH EVERY DAY (Patient taking differently: Take 25 mg by mouth daily. ) 30 tablet 0   metFORMIN (GLUCOPHAGE-XR) 500 MG 24 hr tablet TAKE 2 TABLETS BY MOUTH TWICE A DAY WITH FOOD FOR BLOOD SUGAR (Patient taking differently: Take 1,000 mg by mouth 2 (two) times daily with a meal. ) 120 tablet 0   nitroGLYCERIN (NITROSTAT) 0.3 MG SL tablet Place 1 tablet (0.3 mg total) under the tongue every 5 (five) minutes as needed for chest pain. 25 tablet 1   pantoprazole (PROTONIX) 40 MG tablet TAKE 1 TABLET (40 MG TOTAL) BY MOUTH DAILY. (Patient taking differently: Take 40 mg by mouth daily. ) 90 tablet 0   sertraline (ZOLOFT) 100 MG tablet Take 200 mg by  mouth daily.     zolpidem (AMBIEN) 10 MG tablet Take 10 mg by mouth at bedtime as needed for sleep.      No current facility-administered medications for this visit.   Facility-Administered Medications Ordered in Other Visits  Medication Dose Route  Frequency Provider Last Rate Last Admin   0.9 %  sodium chloride infusion (Manually program via Guardrails IV Fluids)  250 mL Intravenous Once Artis Delay, MD       acetaminophen (TYLENOL) tablet 650 mg  650 mg Oral Once Artis Delay, MD       diphenhydrAMINE (BENADRYL) capsule 25 mg  25 mg Oral Once Artis Delay, MD        REVIEW OF SYSTEMS:   Constitutional: Denies fevers, chills or abnormal night sweats Eyes: Denies blurriness of vision, double vision or watery eyes Ears, nose, mouth, throat, and face: Denies mucositis or sore throat Cardiovascular: Denies palpitation, chest discomfort or lower extremity swelling Skin: Denies abnormal skin rashes Lymphatics: Denies new lymphadenopathy or easy bruising Neurological:Denies numbness, tingling or new weaknesses Behavioral/Psych: Mood is stable, no new changes  All other systems were reviewed with the patient and are negative.  PHYSICAL EXAMINATION: ECOG PERFORMANCE STATUS: 1 - Symptomatic but completely ambulatory  Vitals:   07/18/21 1414  BP: 135/73  Pulse: (!) 109  Resp: 18  Temp: 99.2 F (37.3 C)  SpO2: 100%   Filed Weights   07/18/21 1414  Weight: 130 lb (59 kg)    GENERAL:alert, no distress and comfortable.  She looks very pale SKIN: skin color, texture, turgor are normal, no rashes or significant lesions EYES: normal, conjunctiva are pale and non-injected, sclera clear OROPHARYNX:no exudate, no erythema and lips, buccal mucosa, and tongue normal  NECK: supple, thyroid normal size, non-tender, without nodularity LYMPH:  no palpable lymphadenopathy in the cervical, axillary or inguinal LUNGS: clear to auscultation and percussion with normal breathing effort HEART: regular rate & rhythm and no murmurs and no lower extremity edema ABDOMEN:abdomen soft, non-tender and normal bowel sounds Musculoskeletal:no cyanosis of digits and no clubbing  PSYCH: alert & oriented x 3 with fluent speech NEURO: no focal motor/sensory  deficits

## 2021-07-18 ENCOUNTER — Inpatient Hospital Stay: Payer: Medicaid Other | Attending: Hematology and Oncology

## 2021-07-18 ENCOUNTER — Other Ambulatory Visit: Payer: Self-pay | Admitting: Hematology and Oncology

## 2021-07-18 ENCOUNTER — Encounter: Payer: Self-pay | Admitting: Hematology and Oncology

## 2021-07-18 ENCOUNTER — Other Ambulatory Visit: Payer: Self-pay

## 2021-07-18 ENCOUNTER — Inpatient Hospital Stay: Payer: Medicaid Other | Admitting: Hematology and Oncology

## 2021-07-18 DIAGNOSIS — F5089 Other specified eating disorder: Secondary | ICD-10-CM

## 2021-07-18 DIAGNOSIS — Z880 Allergy status to penicillin: Secondary | ICD-10-CM

## 2021-07-18 DIAGNOSIS — K909 Intestinal malabsorption, unspecified: Secondary | ICD-10-CM

## 2021-07-18 DIAGNOSIS — Z8349 Family history of other endocrine, nutritional and metabolic diseases: Secondary | ICD-10-CM | POA: Insufficient documentation

## 2021-07-18 DIAGNOSIS — I10 Essential (primary) hypertension: Secondary | ICD-10-CM

## 2021-07-18 DIAGNOSIS — Z833 Family history of diabetes mellitus: Secondary | ICD-10-CM | POA: Insufficient documentation

## 2021-07-18 DIAGNOSIS — Z809 Family history of malignant neoplasm, unspecified: Secondary | ICD-10-CM | POA: Diagnosis not present

## 2021-07-18 DIAGNOSIS — N920 Excessive and frequent menstruation with regular cycle: Secondary | ICD-10-CM | POA: Insufficient documentation

## 2021-07-18 DIAGNOSIS — D509 Iron deficiency anemia, unspecified: Secondary | ICD-10-CM

## 2021-07-18 DIAGNOSIS — Z881 Allergy status to other antibiotic agents status: Secondary | ICD-10-CM | POA: Diagnosis not present

## 2021-07-18 DIAGNOSIS — K219 Gastro-esophageal reflux disease without esophagitis: Secondary | ICD-10-CM | POA: Diagnosis not present

## 2021-07-18 DIAGNOSIS — Z8249 Family history of ischemic heart disease and other diseases of the circulatory system: Secondary | ICD-10-CM | POA: Diagnosis not present

## 2021-07-18 DIAGNOSIS — F17210 Nicotine dependence, cigarettes, uncomplicated: Secondary | ICD-10-CM | POA: Diagnosis not present

## 2021-07-18 DIAGNOSIS — Z882 Allergy status to sulfonamides status: Secondary | ICD-10-CM

## 2021-07-18 DIAGNOSIS — K30 Functional dyspepsia: Secondary | ICD-10-CM | POA: Insufficient documentation

## 2021-07-18 DIAGNOSIS — D500 Iron deficiency anemia secondary to blood loss (chronic): Secondary | ICD-10-CM | POA: Insufficient documentation

## 2021-07-18 DIAGNOSIS — F419 Anxiety disorder, unspecified: Secondary | ICD-10-CM

## 2021-07-18 DIAGNOSIS — K5900 Constipation, unspecified: Secondary | ICD-10-CM | POA: Diagnosis not present

## 2021-07-18 DIAGNOSIS — Z79899 Other long term (current) drug therapy: Secondary | ICD-10-CM | POA: Insufficient documentation

## 2021-07-18 LAB — CBC WITH DIFFERENTIAL (CANCER CENTER ONLY)
Abs Immature Granulocytes: 0.02 10*3/uL (ref 0.00–0.07)
Basophils Absolute: 0.1 10*3/uL (ref 0.0–0.1)
Basophils Relative: 1 %
Eosinophils Absolute: 0.1 10*3/uL (ref 0.0–0.5)
Eosinophils Relative: 1 %
HCT: 24.6 % — ABNORMAL LOW (ref 36.0–46.0)
Hemoglobin: 6.1 g/dL — CL (ref 12.0–15.0)
Immature Granulocytes: 0 %
Lymphocytes Relative: 25 %
Lymphs Abs: 1.4 10*3/uL (ref 0.7–4.0)
MCH: 14.6 pg — ABNORMAL LOW (ref 26.0–34.0)
MCHC: 24.8 g/dL — ABNORMAL LOW (ref 30.0–36.0)
MCV: 58.7 fL — ABNORMAL LOW (ref 80.0–100.0)
Monocytes Absolute: 0.3 10*3/uL (ref 0.1–1.0)
Monocytes Relative: 6 %
Neutro Abs: 3.6 10*3/uL (ref 1.7–7.7)
Neutrophils Relative %: 67 %
Platelet Count: 337 10*3/uL (ref 150–400)
RBC: 4.19 MIL/uL (ref 3.87–5.11)
RDW: 23.3 % — ABNORMAL HIGH (ref 11.5–15.5)
WBC Count: 5.5 10*3/uL (ref 4.0–10.5)
nRBC: 0 % (ref 0.0–0.2)

## 2021-07-18 LAB — IRON AND IRON BINDING CAPACITY (CC-WL,HP ONLY)
Iron: 13 ug/dL — ABNORMAL LOW (ref 28–170)
Saturation Ratios: 3 % — ABNORMAL LOW (ref 10.4–31.8)
TIBC: 528 ug/dL — ABNORMAL HIGH (ref 250–450)
UIBC: 515 ug/dL — ABNORMAL HIGH (ref 148–442)

## 2021-07-18 LAB — RETICULOCYTES
Immature Retic Fract: 28.8 % — ABNORMAL HIGH (ref 2.3–15.9)
RBC.: 4.11 MIL/uL (ref 3.87–5.11)
Retic Count, Absolute: 68.6 10*3/uL (ref 19.0–186.0)
Retic Ct Pct: 1.7 % (ref 0.4–3.1)

## 2021-07-18 LAB — SEDIMENTATION RATE: Sed Rate: 12 mm/hr (ref 0–22)

## 2021-07-18 LAB — FERRITIN: Ferritin: 4 ng/mL — ABNORMAL LOW (ref 11–307)

## 2021-07-18 LAB — BASIC METABOLIC PANEL - CANCER CENTER ONLY
Anion gap: 10 (ref 5–15)
BUN: 7 mg/dL (ref 6–20)
CO2: 23 mmol/L (ref 22–32)
Calcium: 9 mg/dL (ref 8.9–10.3)
Chloride: 106 mmol/L (ref 98–111)
Creatinine: 0.72 mg/dL (ref 0.44–1.00)
GFR, Estimated: >60 mL/min (ref >60)
Glucose, Bld: 97 mg/dL (ref 70–99)
Potassium: 4 mmol/L (ref 3.5–5.1)
Sodium: 139 mmol/L (ref 135–145)

## 2021-07-18 LAB — VITAMIN B12: Vitamin B-12: 430 pg/mL (ref 180–914)

## 2021-07-18 LAB — ABO/RH: ABO/RH(D): A POS

## 2021-07-18 LAB — PREPARE RBC (CROSSMATCH)

## 2021-07-18 LAB — TSH: TSH: 1.282 u[IU]/mL (ref 0.308–3.960)

## 2021-07-19 ENCOUNTER — Encounter: Payer: Self-pay | Admitting: Hematology and Oncology

## 2021-07-19 ENCOUNTER — Inpatient Hospital Stay: Payer: Medicaid Other

## 2021-07-19 ENCOUNTER — Telehealth: Payer: Self-pay

## 2021-07-19 DIAGNOSIS — D500 Iron deficiency anemia secondary to blood loss (chronic): Secondary | ICD-10-CM | POA: Diagnosis not present

## 2021-07-19 DIAGNOSIS — D509 Iron deficiency anemia, unspecified: Secondary | ICD-10-CM

## 2021-07-19 MED ORDER — ACETAMINOPHEN 325 MG PO TABS
650.0000 mg | ORAL_TABLET | Freq: Once | ORAL | Status: AC
  Administered 2021-07-19: 650 mg via ORAL
  Filled 2021-07-19: qty 2

## 2021-07-19 MED ORDER — DIPHENHYDRAMINE HCL 25 MG PO CAPS
25.0000 mg | ORAL_CAPSULE | Freq: Once | ORAL | Status: AC
  Administered 2021-07-19: 25 mg via ORAL
  Filled 2021-07-19: qty 1

## 2021-07-19 MED ORDER — SODIUM CHLORIDE 0.9% IV SOLUTION
250.0000 mL | Freq: Once | INTRAVENOUS | Status: AC
  Administered 2021-07-19: 250 mL via INTRAVENOUS

## 2021-07-19 NOTE — Assessment & Plan Note (Signed)
She is known to have history of gastric reflux ?I recommend the patient to refrain from taking any NSAID or aspirin containing products ?

## 2021-07-19 NOTE — Patient Instructions (Signed)
Blood Transfusion, Adult A blood transfusion is a procedure in which you receive blood or a type of blood cell (blood component) through an IV. You may need a blood transfusion when your blood level is low. This may result from a bleeding disorder, illness, injury, or surgery. The blood may come from a donor. You may also be able to donate blood for yourself (autologous blood donation) before a planned surgery. The blood given in a transfusion is made up of different blood components. You may receive: Red blood cells. These carry oxygen to the cells in the body. Platelets. These help your blood to clot. Plasma. This is the liquid part of your blood. It carries proteins and other substances throughout the body. White blood cells. These help you fight infections. If you have hemophilia or another clotting disorder, you may also receive other types of blood products. Tell a health care provider about: Any blood disorders you have. Any previous reactions you have had during a blood transfusion. Any allergies you have. All medicines you are taking, including vitamins, herbs, eye drops, creams, and over-the-counter medicines. Any surgeries you have had. Any medical conditions you have, including any recent fever or cold symptoms. Whether you are pregnant or may be pregnant. What are the risks? Generally, this is a safe procedure. However, problems may occur. The most common problems include: A mild allergic reaction, such as red, swollen areas of skin (hives) and itching. Fever or chills. This may be the body's response to new blood cells received. This may occur during or up to 4 hours after the transfusion. More serious problems may include: Transfusion-associated circulatory overload (TACO), or too much fluid in the lungs. This may cause breathing problems. A serious allergic reaction, such as difficulty breathing or swelling around the face and lips. Transfusion-related acute lung injury  (TRALI), which causes breathing difficulty and low oxygen in the blood. This can occur within hours of the transfusion or several days later. Iron overload. This can happen after receiving many blood transfusions over a period of time. Infection or virus being transmitted. This is rare because donated blood is carefully tested before it is given. Hemolytic transfusion reaction. This is rare. It happens when your body's defense system (immune system)tries to attack the new blood cells. Symptoms may include fever, chills, nausea, low blood pressure, and low back or chest pain. Transfusion-associated graft-versus-host disease (TAGVHD). This is rare. It happens when donated cells attack your body's healthy tissues. What happens before the procedure? Medicines Ask your health care provider about: Changing or stopping your regular medicines. This is especially important if you are taking diabetes medicines or blood thinners. Taking medicines such as aspirin and ibuprofen. These medicines can thin your blood. Do not take these medicines unless your health care provider tells you to take them. Taking over-the-counter medicines, vitamins, herbs, and supplements. General instructions Follow instructions from your health care provider about eating and drinking restrictions. You will have a blood test to determine your blood type. This is necessary to know what kind of blood your body will accept and to match it to the donor blood. If you are going to have a planned surgery, you may be able to do an autologous blood donation. This may be done in case you need to have a transfusion. You will have your temperature, blood pressure, and pulse monitored before the transfusion. If you have had an allergic reaction to a transfusion in the past, you may be given medicine to help prevent   a reaction. This medicine may be given to you by mouth (orally) or through an IV. Set aside time for the blood transfusion. This  procedure generally takes 1-4 hours to complete. What happens during the procedure?  An IV will be inserted into one of your veins. The bag of donated blood will be attached to your IV. The blood will then enter through your vein. Your temperature, blood pressure, and pulse will be monitored regularly during the transfusion. This monitoring is done to detect early signs of a transfusion reaction. Tell your nurse right away if you have any of these symptoms during the transfusion: Shortness of breath or trouble breathing. Chest or back pain. Fever or chills. Hives or itching. If you have any signs or symptoms of a reaction, your transfusion will be stopped and you may be given medicine. When the transfusion is complete, your IV will be removed. Pressure may be applied to the IV site for a few minutes. A bandage (dressing)will be applied. The procedure may vary among health care providers and hospitals. What happens after the procedure? Your temperature, blood pressure, pulse, breathing rate, and blood oxygen level will be monitored until you leave the hospital or clinic. Your blood may be tested to see how you are responding to the transfusion. You may be warmed with fluids or blankets to maintain a normal body temperature. If you receive your blood transfusion in an outpatient setting, you will be told whom to contact to report any reactions. Where to find more information For more information on blood transfusions, visit the [employer]: redcross.org Summary A blood transfusion is a procedure in which you receive blood or a type of blood cell (blood component) through an IV. The blood you receive may come from a donor or be donated by yourself (autologous blood donation) before a planned surgery. The blood given in a transfusion is made up of different blood components. You may receive red blood cells, platelets, plasma, or white blood cells depending on the condition treated. Your  temperature, blood pressure, and pulse will be monitored before, during, and after the transfusion. After the transfusion, your blood may be tested to see how your body has responded. This information is not intended to replace advice given to you by your health care provider. Make sure you discuss any questions you have with your health care provider. Document Revised: 02/20/2019 Document Reviewed: 10/10/2018 Elsevier Patient Education  2022 [employer] Inc.  

## 2021-07-19 NOTE — Telephone Encounter (Signed)
Called and requested EGD and colonoscopy records. Left fax # of the office. ?

## 2021-07-19 NOTE — Assessment & Plan Note (Signed)
She has chronic, severe iron deficiency anemia likely due to prior history of menorrhagia and potentially GI blood loss ?I recommend the patient to refrain from taking any NSAID or aspirin containing products ?We will request prior endoscopy reports from her gastroenterologist ?She is profoundly symptomatic from severe anemia ?I recommend 2 units of blood transfusion followed by 5 doses of IV iron sucrose infusion with plan to see her back next month for further follow-up ?We discussed some of the risks, benefits, and alternatives of blood transfusions. The patient is symptomatic from anemia and the hemoglobin level is critically low.  Some of the side-effects to be expected including risks of transfusion reactions, chills, infection, syndrome of volume overload and risk of hospitalization from various reasons and the patient is willing to proceed and went ahead to sign consent today. ?The most likely cause of her anemia is due to chronic blood loss/malabsorption syndrome. ?We discussed some of the risks, benefits, and alternatives of intravenous iron infusions. The patient is symptomatic from anemia and the iron level is critically low. She tolerated oral iron supplement poorly and desires to achieved higher levels of iron faster for adequate hematopoesis. Some of the side-effects to be expected including risks of infusion reactions, phlebitis, headaches, nausea and fatigue.  The patient is willing to proceed. Patient education material was dispensed.  ?Goal is to keep ferritin level greater than 50 and resolution of anemia ? ?

## 2021-07-20 LAB — TYPE AND SCREEN
ABO/RH(D): A POS
Antibody Screen: NEGATIVE
Unit division: 0
Unit division: 0

## 2021-07-20 LAB — BPAM RBC
Blood Product Expiration Date: 202304142359
Blood Product Expiration Date: 202304142359
ISSUE DATE / TIME: 202303210858
ISSUE DATE / TIME: 202303210858
Unit Type and Rh: 6200
Unit Type and Rh: 6200

## 2021-07-26 ENCOUNTER — Inpatient Hospital Stay: Payer: Medicaid Other

## 2021-07-26 ENCOUNTER — Other Ambulatory Visit: Payer: Self-pay

## 2021-07-26 VITALS — BP 111/85 | HR 86 | Temp 98.6°F | Resp 18

## 2021-07-26 DIAGNOSIS — D500 Iron deficiency anemia secondary to blood loss (chronic): Secondary | ICD-10-CM | POA: Diagnosis not present

## 2021-07-26 DIAGNOSIS — D509 Iron deficiency anemia, unspecified: Secondary | ICD-10-CM

## 2021-07-26 MED ORDER — SODIUM CHLORIDE 0.9 % IV SOLN: INTRAVENOUS | Status: DC

## 2021-07-26 MED ORDER — SODIUM CHLORIDE 0.9 % IV SOLN
400.0000 mg | Freq: Once | INTRAVENOUS | Status: AC
  Administered 2021-07-26: 400 mg via INTRAVENOUS
  Filled 2021-07-26: qty 20

## 2021-07-26 NOTE — Patient Instructions (Signed)

## 2021-07-29 ENCOUNTER — Inpatient Hospital Stay: Payer: Medicaid Other

## 2021-07-29 ENCOUNTER — Encounter: Payer: Self-pay | Admitting: Hematology and Oncology

## 2021-07-29 ENCOUNTER — Other Ambulatory Visit: Payer: Self-pay

## 2021-07-29 VITALS — BP 107/73 | HR 83 | Temp 97.5°F | Resp 18 | Wt 127.8 lb

## 2021-07-29 DIAGNOSIS — D500 Iron deficiency anemia secondary to blood loss (chronic): Secondary | ICD-10-CM | POA: Diagnosis not present

## 2021-07-29 DIAGNOSIS — D509 Iron deficiency anemia, unspecified: Secondary | ICD-10-CM

## 2021-07-29 MED ORDER — SODIUM CHLORIDE 0.9 % IV SOLN: INTRAVENOUS | Status: DC

## 2021-07-29 MED ORDER — SODIUM CHLORIDE 0.9 % IV SOLN
400.0000 mg | Freq: Once | INTRAVENOUS | Status: AC
  Administered 2021-07-29: 400 mg via INTRAVENOUS
  Filled 2021-07-29: qty 20

## 2021-07-29 NOTE — Patient Instructions (Signed)
[facility]  Discharge Instructions: Thank you for choosing [facility] to provide your oncology and hematology care.   If you have a lab appointment with the Cancer Center, please go directly to the Cancer Center and check in at the registration area.   Wear comfortable clothing and clothing appropriate for easy access to any Portacath or PICC line.   We strive to give you quality time with your provider. You may need to reschedule your appointment if you arrive late (15 or more minutes).  Arriving late affects you and other patients whose appointments are after yours.  Also, if you miss three or more appointments without notifying the office, you may be dismissed from the clinic at the provider's discretion.      For prescription refill requests, have your pharmacy contact our office and allow 72 hours for refills to be completed.       To help prevent nausea and vomiting after your treatment, we encourage you to take your nausea medication as directed.  BELOW ARE SYMPTOMS THAT SHOULD BE REPORTED IMMEDIATELY: *FEVER GREATER THAN 100.4 F (38 C) OR HIGHER *CHILLS OR SWEATING *NAUSEA AND VOMITING THAT IS NOT CONTROLLED WITH YOUR NAUSEA MEDICATION *UNUSUAL SHORTNESS OF BREATH *UNUSUAL BRUISING OR BLEEDING *URINARY PROBLEMS (pain or burning when urinating, or frequent urination) *BOWEL PROBLEMS (unusual diarrhea, constipation, pain near the anus) TENDERNESS IN MOUTH AND THROAT WITH OR WITHOUT PRESENCE OF ULCERS (sore throat, sores in mouth, or a toothache) UNUSUAL RASH, SWELLING OR PAIN  UNUSUAL VAGINAL DISCHARGE OR ITCHING   Items with * indicate a potential emergency and should be followed up as soon as possible or go to the Emergency Department if any problems should occur.  Please show the CHEMOTHERAPY ALERT CARD or IMMUNOTHERAPY ALERT CARD at check-in to the Emergency Department and triage nurse.  Should you have questions after your  visit or need to cancel or reschedule your appointment, please contact [facility]  Dept: [phone]  and follow the prompts.  Office hours are 8:00 a.m. to 4:30 p.m. Monday - Friday. Please note that voicemails left after 4:00 p.m. may not be returned until the following business day.  We are closed weekends and major holidays. You have access to a nurse at all times for urgent questions. Please call the main number to the clinic Dept: [phone] and follow the prompts.   For any non-urgent questions, you may also contact your provider using MyChart. We now offer e-Visits for anyone 18 and older to request care online for non-urgent symptoms. For details visit [email or web address].   Also download the MyChart app! Go to the app store, search "MyChart", open the app, select [facility], and log in with your MyChart username and password.  Due to Covid, a mask is required upon entering the hospital/clinic. If you do not have a mask, one will be given to you upon arrival. For doctor visits, patients may have 1 support person aged 18 or older with them. For treatment visits, patients cannot have anyone with them due to current Covid guidelines and our immunocompromised population.   

## 2021-07-29 NOTE — Progress Notes (Signed)
Iron infusion given per orders. Patient tolerated it well without problems. Vitals stable and discharged home from clinic ambulatory. Follow up as scheduled.  

## 2021-08-02 ENCOUNTER — Inpatient Hospital Stay: Payer: Medicaid Other

## 2021-08-02 ENCOUNTER — Other Ambulatory Visit: Payer: Self-pay

## 2021-08-02 ENCOUNTER — Inpatient Hospital Stay: Payer: Medicaid Other | Attending: Hematology and Oncology

## 2021-08-02 VITALS — BP 112/81 | HR 88 | Temp 98.2°F | Resp 16

## 2021-08-02 DIAGNOSIS — D509 Iron deficiency anemia, unspecified: Secondary | ICD-10-CM | POA: Diagnosis present

## 2021-08-02 MED ORDER — SODIUM CHLORIDE 0.9 % IV SOLN: Freq: Once | INTRAVENOUS | Status: AC

## 2021-08-02 MED ORDER — SODIUM CHLORIDE 0.9 % IV SOLN
400.0000 mg | Freq: Once | INTRAVENOUS | Status: AC
  Administered 2021-08-02: 400 mg via INTRAVENOUS
  Filled 2021-08-02: qty 20

## 2021-08-02 NOTE — Patient Instructions (Signed)

## 2021-08-02 NOTE — Progress Notes (Signed)
Pt declined to stay for 30 min post obs. Ambulatory to lobby with VSS. ?

## 2021-08-05 ENCOUNTER — Inpatient Hospital Stay: Payer: Medicaid Other

## 2021-08-06 ENCOUNTER — Encounter: Payer: Self-pay | Admitting: Hematology and Oncology

## 2021-08-09 ENCOUNTER — Other Ambulatory Visit: Payer: Self-pay

## 2021-08-09 ENCOUNTER — Inpatient Hospital Stay: Payer: Medicaid Other

## 2021-08-09 VITALS — BP 115/75 | HR 89 | Temp 98.4°F | Resp 17

## 2021-08-09 DIAGNOSIS — D509 Iron deficiency anemia, unspecified: Secondary | ICD-10-CM | POA: Diagnosis not present

## 2021-08-09 MED ORDER — SODIUM CHLORIDE 0.9 % IV SOLN
400.0000 mg | Freq: Once | INTRAVENOUS | Status: AC
  Administered 2021-08-09: 400 mg via INTRAVENOUS
  Filled 2021-08-09: qty 20

## 2021-08-09 MED ORDER — SODIUM CHLORIDE 0.9 % IV SOLN: Freq: Once | INTRAVENOUS | Status: AC

## 2021-08-09 NOTE — Patient Instructions (Signed)

## 2021-08-10 ENCOUNTER — Encounter: Payer: Self-pay | Admitting: Hematology and Oncology

## 2021-08-29 ENCOUNTER — Encounter: Payer: Self-pay | Admitting: Hematology and Oncology

## 2021-08-29 ENCOUNTER — Inpatient Hospital Stay: Payer: Medicaid Other

## 2021-08-29 ENCOUNTER — Inpatient Hospital Stay: Payer: Medicaid Other | Attending: Hematology and Oncology

## 2021-08-29 ENCOUNTER — Other Ambulatory Visit: Payer: Self-pay

## 2021-08-29 ENCOUNTER — Inpatient Hospital Stay (HOSPITAL_BASED_OUTPATIENT_CLINIC_OR_DEPARTMENT_OTHER): Payer: Medicaid Other | Admitting: Hematology and Oncology

## 2021-08-29 DIAGNOSIS — D509 Iron deficiency anemia, unspecified: Secondary | ICD-10-CM | POA: Diagnosis present

## 2021-08-29 DIAGNOSIS — Z881 Allergy status to other antibiotic agents status: Secondary | ICD-10-CM | POA: Insufficient documentation

## 2021-08-29 DIAGNOSIS — Z79899 Other long term (current) drug therapy: Secondary | ICD-10-CM | POA: Diagnosis not present

## 2021-08-29 DIAGNOSIS — Z880 Allergy status to penicillin: Secondary | ICD-10-CM | POA: Diagnosis not present

## 2021-08-29 DIAGNOSIS — K219 Gastro-esophageal reflux disease without esophagitis: Secondary | ICD-10-CM | POA: Insufficient documentation

## 2021-08-29 DIAGNOSIS — F5089 Other specified eating disorder: Secondary | ICD-10-CM | POA: Insufficient documentation

## 2021-08-29 DIAGNOSIS — Z882 Allergy status to sulfonamides status: Secondary | ICD-10-CM | POA: Diagnosis not present

## 2021-08-29 LAB — CBC WITH DIFFERENTIAL (CANCER CENTER ONLY)
Abs Immature Granulocytes: 0.01 10*3/uL (ref 0.00–0.07)
Basophils Absolute: 0.1 10*3/uL (ref 0.0–0.1)
Basophils Relative: 1 %
Eosinophils Absolute: 0.1 10*3/uL (ref 0.0–0.5)
Eosinophils Relative: 1 %
HCT: 43.9 % (ref 36.0–46.0)
Hemoglobin: 14.1 g/dL (ref 12.0–15.0)
Immature Granulocytes: 0 %
Lymphocytes Relative: 33 %
Lymphs Abs: 1.7 10*3/uL (ref 0.7–4.0)
MCH: 24.7 pg — ABNORMAL LOW (ref 26.0–34.0)
MCHC: 32.1 g/dL (ref 30.0–36.0)
MCV: 77 fL — ABNORMAL LOW (ref 80.0–100.0)
Monocytes Absolute: 0.3 10*3/uL (ref 0.1–1.0)
Monocytes Relative: 5 %
Neutro Abs: 3.2 10*3/uL (ref 1.7–7.7)
Neutrophils Relative %: 60 %
Platelet Count: 268 10*3/uL (ref 150–400)
RBC: 5.7 MIL/uL — ABNORMAL HIGH (ref 3.87–5.11)
WBC Count: 5.3 10*3/uL (ref 4.0–10.5)
nRBC: 0 % (ref 0.0–0.2)

## 2021-08-29 LAB — IRON AND IRON BINDING CAPACITY (CC-WL,HP ONLY)
Iron: 68 ug/dL (ref 28–170)
Saturation Ratios: 23 % (ref 10.4–31.8)
TIBC: 298 ug/dL (ref 250–450)
UIBC: 230 ug/dL (ref 148–442)

## 2021-08-29 LAB — FERRITIN: Ferritin: 180 ng/mL (ref 11–307)

## 2021-08-29 LAB — SAMPLE TO BLOOD BANK

## 2021-08-29 NOTE — Progress Notes (Signed)
? ?[facility] ?OFFICE PROGRESS NOTE ? ?Pcp, No ? ?ASSESSMENT & PLAN:  ?Iron deficiency anemia ?Her iron deficiency anemia has resolved ?The patient is counseled not to take regular doses of aspirin/BC powder/ibuprofen for chronic headaches ?We discussed future follow-up ?She is interested to come back in a few months for repeat blood work and monitoring ?The patient is educated to watch out for signs and symptoms of anemia ? ?Esophageal reflux ?She is known to have history of gastric reflux ?I recommend the patient to refrain from taking any NSAID or aspirin containing products ? ?No orders of the defined types were placed in this encounter. ? ? ?The total time spent in the appointment was 20 minutes encounter with patients including review of chart and various tests results, discussions about plan of care and coordination of care plan ? ? All questions were answered. The patient knows to call the clinic with any problems, questions or concerns. No barriers to learning was detected. ? ? ? Artis Delay, MD ?5/1/20233:17 PM ? ?INTERVAL HISTORY: ?Pamela Howe 51 y.o. female returns for follow-up on severe iron deficiency anemia likely due to GI loss ?Since last time I saw her, she felt better ?Her energy level has improved ?The patient denies any recent signs or symptoms of bleeding such as spontaneous epistaxis, hematuria or hematochezia. ?She has not taken any aspirin containing products or NSAID ? ?SUMMARY OF HEMATOLOGIC HISTORY: ?Pamela Howe 51 y.o. female is here because of anemia ?She is known to have anemia in 2022 ?Most recently, on 07/05/2021, she had CBC checked which showed severe anemia ?White blood cell count 6.2, hemoglobin 5.9, MCV 53.6 and platelet count 299 ? ?She was found to have abnormal CBC from routine blood work monitoring ?The patient is known to be anemic for many years ?However, due to her anxiety, she does not seek help or medical attention for a while ?Prior to COVID/pandemic  lockdown, she had EGD and colonoscopy reviewed by Dr. Dulce Sellar and was told it was normal ?She had 6 pregnancies and has been told she was anemic during pregnancies ?She have history of menorrhagia status post uterine ablation and she have no further menstrual cycle since then ?She had recent chest pain on exertion, shortness of breath on minimal exertion, pre-syncopal episodes, profound fatigue, leg cramps, restless leg and palpitations. ?She had not noticed any recent bleeding such as epistaxis, hematuria or hematochezia ?She has taken occasional NSAID ?Her last colonoscopy was approximately 3 years ago ?She had no prior history or diagnosis of cancer. Her age appropriate screening programs are up-to-date. ?She has excessive pica with ice craving; she eats a variety of diet. ?She never donated blood or received blood transfusion ?The patient was prescribed oral iron supplements and she takes 2 tablets daily and that caused severe constipation and stomach upset ?She received 4 doses of intravenous iron from March to April 2023 with resolution of anemia ? ?I have reviewed the past medical history, past surgical history, social history and family history with the patient and they are unchanged from previous note. ? ?ALLERGIES:  is allergic to ace inhibitors, doxycycline, penicillins, and sulfa antibiotics. ? ?MEDICATIONS:  ?Current Outpatient Medications  ?Medication Sig Dispense Refill  ? ALPRAZolam (XANAX) 0.5 MG tablet TAKE 1 TO 1.5 TABLETS BY MOUTH EVERY DAY AS NEEDED MUST LAST 30 DAYS (Patient not taking: Reported on 09/29/2018) 45 tablet 0  ? Aspirin-Salicylamide-Caffeine (BC HEADACHE POWDER PO) Take 1 Package by mouth daily as needed (headache).    ?  atorvastatin (LIPITOR) 40 MG tablet TAKE 1 TABLET BY MOUTH EVERY DAY (Patient taking differently: Take 40 mg by mouth at bedtime. ) 30 tablet 0  ? cetirizine (ZYRTEC) 10 MG tablet Take 10 mg by mouth daily as needed for allergies.     ? diphenhydrAMINE (BENADRYL) 25  MG tablet Take 50 mg by mouth every 6 (six) hours as needed for allergies.     ? hydrochlorothiazide (HYDRODIURIL) 25 MG tablet TAKE 1 TABLET BY MOUTH EVERY DAY (Patient taking differently: Take 25 mg by mouth daily. ) 30 tablet 0  ? metFORMIN (GLUCOPHAGE-XR) 500 MG 24 hr tablet TAKE 2 TABLETS BY MOUTH TWICE A DAY WITH FOOD FOR BLOOD SUGAR (Patient taking differently: Take 1,000 mg by mouth 2 (two) times daily with a meal. ) 120 tablet 0  ? nitroGLYCERIN (NITROSTAT) 0.3 MG SL tablet Place 1 tablet (0.3 mg total) under the tongue every 5 (five) minutes as needed for chest pain. 25 tablet 1  ? pantoprazole (PROTONIX) 40 MG tablet TAKE 1 TABLET (40 MG TOTAL) BY MOUTH DAILY. (Patient taking differently: Take 40 mg by mouth daily. ) 90 tablet 0  ? sertraline (ZOLOFT) 100 MG tablet Take 200 mg by mouth daily.    ? zolpidem (AMBIEN) 10 MG tablet Take 10 mg by mouth at bedtime as needed for sleep.     ? ?No current facility-administered medications for this visit.  ?  ? ?REVIEW OF SYSTEMS:   ?Constitutional: Denies fevers, chills or night sweats ?Eyes: Denies blurriness of vision ?Ears, nose, mouth, throat, and face: Denies mucositis or sore throat ?Respiratory: Denies cough, dyspnea or wheezes ?Cardiovascular: Denies palpitation, chest discomfort or lower extremity swelling ?Gastrointestinal:  Denies nausea, heartburn or change in bowel habits ?Skin: Denies abnormal skin rashes ?Lymphatics: Denies new lymphadenopathy or easy bruising ?Neurological:Denies numbness, tingling or new weaknesses ?Behavioral/Psych: Mood is stable, no new changes  ?All other systems were reviewed with the patient and are negative. ? ?PHYSICAL EXAMINATION: ?ECOG PERFORMANCE STATUS: 0 - Asymptomatic ? ?Vitals:  ? 08/29/21 1101  ?BP: (!) 142/80  ?Pulse: 89  ?Resp: 18  ?Temp: 97.6 ?F (36.4 ?C)  ?SpO2: 100%  ? ?Filed Weights  ? 08/29/21 1101  ?Weight: 132 lb 12.8 oz (60.2 kg)  ? ? ?GENERAL:alert, no distress and comfortable ?NEURO: alert & oriented x  3 with fluent speech, no focal motor/sensory deficits ? ?LABORATORY DATA:  ?I have reviewed the data as listed ? ?   ?Component Value Date/Time  ? NA 139 07/18/2021 1450  ? K 4.0 07/18/2021 1450  ? CL 106 07/18/2021 1450  ? CO2 23 07/18/2021 1450  ? GLUCOSE 97 07/18/2021 1450  ? BUN 7 07/18/2021 1450  ? CREATININE 0.72 07/18/2021 1450  ? CREATININE 0.69 03/09/2016 1507  ? CALCIUM 9.0 07/18/2021 1450  ? PROT 8.6 (H) 09/19/2020 0303  ? ALBUMIN 4.9 09/19/2020 0303  ? AST 29 09/19/2020 0303  ? ALT 15 09/19/2020 0303  ? ALKPHOS 70 09/19/2020 0303  ? BILITOT 0.4 09/19/2020 0303  ? GFRNONAA >60 07/18/2021 1450  ? GFRNONAA >89 03/09/2016 1507  ? GFRAA >60 09/29/2018 0811  ? GFRAA >89 03/09/2016 1507  ? ? ?No results found for: SPEP, UPEP ? ?Lab Results  ?Component Value Date  ? WBC 5.3 08/29/2021  ? NEUTROABS 3.2 08/29/2021  ? HGB 14.1 08/29/2021  ? HCT 43.9 08/29/2021  ? MCV 77.0 (L) 08/29/2021  ? PLT 268 08/29/2021  ? ? ?  Chemistry   ?   ?Component Value  Date/Time  ? NA 139 07/18/2021 1450  ? K 4.0 07/18/2021 1450  ? CL 106 07/18/2021 1450  ? CO2 23 07/18/2021 1450  ? BUN 7 07/18/2021 1450  ? CREATININE 0.72 07/18/2021 1450  ? CREATININE 0.69 03/09/2016 1507  ?    ?Component Value Date/Time  ? CALCIUM 9.0 07/18/2021 1450  ? ALKPHOS 70 09/19/2020 0303  ? AST 29 09/19/2020 0303  ? ALT 15 09/19/2020 0303  ? BILITOT 0.4 09/19/2020 0303  ?  ? ?

## 2021-08-29 NOTE — Assessment & Plan Note (Signed)
Her iron deficiency anemia has resolved ?The patient is counseled not to take regular doses of aspirin/BC powder/ibuprofen for chronic headaches ?We discussed future follow-up ?She is interested to come back in a few months for repeat blood work and monitoring ?The patient is educated to watch out for signs and symptoms of anemia ?

## 2021-08-29 NOTE — Assessment & Plan Note (Signed)
She is known to have history of gastric reflux ?I recommend the patient to refrain from taking any NSAID or aspirin containing products ?

## 2021-12-29 ENCOUNTER — Inpatient Hospital Stay: Payer: Medicaid Other | Attending: Hematology and Oncology

## 2021-12-29 ENCOUNTER — Inpatient Hospital Stay: Payer: Medicaid Other | Admitting: Hematology and Oncology
